# Patient Record
Sex: Male | Born: 1995
Health system: Southern US, Community
[De-identification: ages and names within clinical notes are randomized; demographics above are authoritative.]

## PROBLEM LIST (undated history)

## (undated) DIAGNOSIS — R109 Unspecified abdominal pain: Secondary | ICD-10-CM

## (undated) DIAGNOSIS — R110 Nausea: Secondary | ICD-10-CM

## (undated) DIAGNOSIS — K5900 Constipation, unspecified: Secondary | ICD-10-CM

## (undated) HISTORY — DX: Unspecified abdominal pain: R10.9

## (undated) HISTORY — PX: TONSILLECTOMY: SUR1361

## (undated) HISTORY — DX: Nausea: R11.0

## (undated) HISTORY — PX: EUSTACHIAN TUBE DILATION: SHX6770

## (undated) HISTORY — DX: Constipation, unspecified: K59.00

---
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
## 2016-04-28 DIAGNOSIS — J019 Acute sinusitis, unspecified: Secondary | ICD-10-CM | POA: Diagnosis not present

## 2016-04-28 DIAGNOSIS — R110 Nausea: Secondary | ICD-10-CM | POA: Diagnosis not present

## 2016-05-25 DIAGNOSIS — Z713 Dietary counseling and surveillance: Secondary | ICD-10-CM | POA: Diagnosis not present

## 2016-05-25 DIAGNOSIS — Z7189 Other specified counseling: Secondary | ICD-10-CM | POA: Diagnosis not present

## 2016-05-25 DIAGNOSIS — Z0001 Encounter for general adult medical examination with abnormal findings: Secondary | ICD-10-CM | POA: Diagnosis not present

## 2016-10-13 ENCOUNTER — Encounter: Payer: Self-pay | Admitting: Emergency Medicine

## 2016-10-13 DIAGNOSIS — F17290 Nicotine dependence, other tobacco product, uncomplicated: Secondary | ICD-10-CM | POA: Diagnosis not present

## 2016-10-13 DIAGNOSIS — R112 Nausea with vomiting, unspecified: Secondary | ICD-10-CM | POA: Insufficient documentation

## 2016-10-13 LAB — URINALYSIS, COMPLETE (UACMP) WITH MICROSCOPIC
BILIRUBIN URINE: NEGATIVE
Bacteria, UA: NONE SEEN
GLUCOSE, UA: NEGATIVE mg/dL
Ketones, ur: NEGATIVE mg/dL
LEUKOCYTES UA: NEGATIVE
Nitrite: NEGATIVE
PH: 6 (ref 5.0–8.0)
Protein, ur: NEGATIVE mg/dL
SPECIFIC GRAVITY, URINE: 1.017 (ref 1.005–1.030)
SQUAMOUS EPITHELIAL / LPF: NONE SEEN

## 2016-10-13 LAB — CBC
HCT: 40.6 % (ref 40.0–52.0)
Hemoglobin: 14.3 g/dL (ref 13.0–18.0)
MCH: 30.7 pg (ref 26.0–34.0)
MCHC: 35.3 g/dL (ref 32.0–36.0)
MCV: 86.9 fL (ref 80.0–100.0)
PLATELETS: 229 10*3/uL (ref 150–440)
RBC: 4.68 MIL/uL (ref 4.40–5.90)
RDW: 12.5 % (ref 11.5–14.5)
WBC: 10.3 10*3/uL (ref 3.8–10.6)

## 2016-10-13 NOTE — ED Triage Notes (Signed)
Pt states that he has been feeling nauseated for a few hours and threw up one time. Pt is ambulatory to triage with c/o lump in throat that is getting bigger. No visible mass spotted in airway by this RN. Pt is in NAD at this time.

## 2016-10-14 ENCOUNTER — Emergency Department
Admission: EM | Admit: 2016-10-14 | Discharge: 2016-10-14 | Disposition: A | Discharge status: Home / Self Care | Payer: 59 | Attending: Emergency Medicine | Admitting: Emergency Medicine

## 2016-10-14 DIAGNOSIS — R112 Nausea with vomiting, unspecified: Secondary | ICD-10-CM

## 2016-10-14 LAB — COMPREHENSIVE METABOLIC PANEL
ALT: 14 U/L — AB (ref 17–63)
ANION GAP: 7 (ref 5–15)
AST: 21 U/L (ref 15–41)
Albumin: 5 g/dL (ref 3.5–5.0)
Alkaline Phosphatase: 63 U/L (ref 38–126)
BUN: 15 mg/dL (ref 6–20)
CALCIUM: 9.6 mg/dL (ref 8.9–10.3)
CHLORIDE: 105 mmol/L (ref 101–111)
CO2: 27 mmol/L (ref 22–32)
CREATININE: 1.1 mg/dL (ref 0.61–1.24)
Glucose, Bld: 102 mg/dL — ABNORMAL HIGH (ref 65–99)
Potassium: 3.8 mmol/L (ref 3.5–5.1)
SODIUM: 139 mmol/L (ref 135–145)
Total Bilirubin: 0.8 mg/dL (ref 0.3–1.2)
Total Protein: 7.8 g/dL (ref 6.5–8.1)

## 2016-10-14 LAB — LIPASE, BLOOD: LIPASE: 21 U/L (ref 11–51)

## 2016-10-14 MED ORDER — ONDANSETRON HCL 4 MG PO TABS: 4.0000 mg | ORAL_TABLET | Freq: Every day | ORAL | 0 refills | Status: DC | PRN

## 2016-10-14 NOTE — ED Provider Notes (Signed)
[facility] Emergency Department Provider Note  ____________________________________________   First MD Initiated Contact with Patient 10/14/16 [phone]     (approximate)  I have reviewed the triage vital signs and the nursing notes.   HISTORY  Chief Complaint Emesis   HPI Carl Krueger is a 21 y.o. male with a history of abdominal pain, nausea and constipation was present to the emergency department today with nausea, vomiting and near syncope after vomiting. He is here with his family who says that these issues have been ongoing for years. He was supposed to have an appointment with gastroenterology this past April but he missed the appointment. His mother says that it is very difficult to get the patient to follow-up with his doctors appointments. The patient at this point says that he is asymptomatic. He no longer has the feeling of a lump in his throat which he had just after vomiting. He is not feeling lightheaded, nauseous and denies any pain at this time. He says that he also just had a normal bowel movement prior to me examining him.   Past Medical History:  Diagnosis Date  . Abdominal pain   . Constipation   . Nausea     Patient Active Problem List   Diagnosis Date Noted  . Constipation   . Nausea   . Abdominal pain   . ALLERGIC RHINITIS 01/29/2010    Past Surgical History:  Procedure Laterality Date  . TONSILLECTOMY      Prior to Admission medications   Medication Sig Start Date End Date Taking? Authorizing Provider  polyethylene glycol powder (GLYCOLAX/MIRALAX) powder Take 17 g by mouth daily.    [provider]  promethazine (PHENERGAN) 12.5 MG tablet Take 1 tablet (12.5 mg total) by mouth every 6 (six) hours as needed for nausea or vomiting. 11/06/15   Rebecka ApleyWebster, Allison P, MD    Allergies Patient has no known allergies.  No family history on file.  Social History Social History  Substance Use Topics  . Smoking  status: Current Every Day Smoker    Types: E-cigarettes  . Smokeless tobacco: Never Used  . Alcohol use No    Review of Systems  Constitutional: No fever/chills Eyes: No visual changes. ENT: No sore throat. Cardiovascular: Denies chest pain. Respiratory: Denies shortness of breath. Gastrointestinal: No abdominal pain.   No diarrhea.  No constipation. Genitourinary: Negative for dysuria. Musculoskeletal: Negative for back pain. Skin: Negative for rash. Neurological: Negative for headaches, focal weakness or numbness.   ____________________________________________   PHYSICAL EXAM:  VITAL SIGNS: ED Triage Vitals  Enc Vitals Group     BP 10/13/16 2315 109/63     Pulse Rate 10/13/16 2315 66     Resp 10/13/16 2315 18     Temp 10/14/16 0325 98.3 F (36.8 C)     Temp Source 10/14/16 0325 Oral     SpO2 10/13/16 2315 100 %     Weight 10/13/16 2316 140 lb (63.5 kg)     Height 10/13/16 2316 5\' 8"  (1.727 m)     Head Circumference --      Peak Flow --      Pain Score 10/13/16 2314 0     Pain Loc --      Pain Edu? --      Excl. in GC? --     Constitutional: Alert and oriented. Well appearing and in no acute distress. Eyes: Conjunctivae are normal.  Head: Atraumatic. Nose: No congestion/rhinnorhea. Mouth/Throat: Mucous membranes are  moist.  Neck: No stridor.   Cardiovascular: Normal rate, regular rhythm. Grossly normal heart sounds.  Respiratory: Normal respiratory effort.  No retractions. Lungs CTAB. Gastrointestinal: Soft and nontender. No distention. No CVA tenderness. Musculoskeletal: No lower extremity tenderness nor edema.  No joint effusions. Neurologic:  Normal speech and language. No gross focal neurologic deficits are appreciated. Skin:  Skin is warm, dry and intact. No rash noted. Psychiatric: Mood and affect are normal. Speech and behavior are normal.  ____________________________________________   LABS (all labs ordered are listed, but only abnormal results  are displayed)  Labs Reviewed  COMPREHENSIVE METABOLIC PANEL - Abnormal; Notable for the following:       Result Value   Glucose, Bld 102 (*)    ALT 14 (*)    All other components within normal limits  URINALYSIS, COMPLETE (UACMP) WITH MICROSCOPIC - Abnormal; Notable for the following:    Color, Urine YELLOW (*)    APPearance CLEAR (*)    Hgb urine dipstick MODERATE (*)    All other components within normal limits  LIPASE, BLOOD  CBC   ____________________________________________  EKG   ____________________________________________  RADIOLOGY   ____________________________________________   PROCEDURES  Procedure(s) performed:   Procedures  Critical Care performed:   ____________________________________________   INITIAL IMPRESSION / ASSESSMENT AND PLAN / ED COURSE  Pertinent labs & imaging results that were available during my care of the patient were reviewed by me and considered in my medical decision making (see chart for details).  Patient with very reassuring vital signs as well as labs. Ongoing issues for years. I do not believe the patient requires any further workup in the emergency department today. I urged the patient as well as his family to reschedule with the gastroenterologist. Vivianne Spencehey're also requesting a prescription for Zofran. Patient has been able to tolerate by mouth fluids since vomiting last several hours ago. Likely vagal episode causing the lightheadedness after vomiting. The patient has not had any episodes since. He is not reporting any chest pain or palpitations.      ____________________________________________   FINAL CLINICAL IMPRESSION(S) / ED DIAGNOSES  Nausea and vomiting.    NEW MEDICATIONS STARTED DURING THIS VISIT:  New Prescriptions   No medications on file     Note:  This document was prepared using Dragon voice recognition software and may include unintentional dictation errors.     Myrna BlazerSchaevitz, Fermon Ureta Matthew,  MD 10/14/16 [phone]

## 2016-11-21 DIAGNOSIS — R1031 Right lower quadrant pain: Secondary | ICD-10-CM | POA: Diagnosis not present

## 2016-11-21 DIAGNOSIS — R110 Nausea: Secondary | ICD-10-CM | POA: Diagnosis not present

## 2016-11-21 DIAGNOSIS — K30 Functional dyspepsia: Secondary | ICD-10-CM | POA: Diagnosis not present

## 2016-12-18 ENCOUNTER — Emergency Department: Payer: 59

## 2016-12-18 ENCOUNTER — Emergency Department
Admission: EM | Admit: 2016-12-18 | Discharge: 2016-12-18 | Disposition: A | Discharge status: Home / Self Care | Payer: 59 | Attending: Emergency Medicine | Admitting: Emergency Medicine

## 2016-12-18 ENCOUNTER — Encounter: Payer: Self-pay | Admitting: Emergency Medicine

## 2016-12-18 DIAGNOSIS — F17290 Nicotine dependence, other tobacco product, uncomplicated: Secondary | ICD-10-CM | POA: Diagnosis not present

## 2016-12-18 DIAGNOSIS — R1032 Left lower quadrant pain: Secondary | ICD-10-CM | POA: Diagnosis not present

## 2016-12-18 DIAGNOSIS — N200 Calculus of kidney: Secondary | ICD-10-CM | POA: Insufficient documentation

## 2016-12-18 LAB — URINALYSIS, COMPLETE (UACMP) WITH MICROSCOPIC
BACTERIA UA: NONE SEEN
BILIRUBIN URINE: NEGATIVE
Glucose, UA: NEGATIVE mg/dL
HGB URINE DIPSTICK: NEGATIVE
Ketones, ur: NEGATIVE mg/dL
Leukocytes, UA: NEGATIVE
Nitrite: NEGATIVE
PROTEIN: NEGATIVE mg/dL
RBC / HPF: NONE SEEN RBC/hpf (ref 0–5)
Specific Gravity, Urine: 1.012 (ref 1.005–1.030)
Squamous Epithelial / LPF: NONE SEEN
WBC UA: NONE SEEN WBC/hpf (ref 0–5)
pH: 8 (ref 5.0–8.0)

## 2016-12-18 LAB — LIPASE, BLOOD: LIPASE: 18 U/L (ref 11–51)

## 2016-12-18 LAB — CBC
HEMATOCRIT: 42.3 % (ref 40.0–52.0)
HEMOGLOBIN: 14.6 g/dL (ref 13.0–18.0)
MCH: 30.6 pg (ref 26.0–34.0)
MCHC: 34.5 g/dL (ref 32.0–36.0)
MCV: 88.7 fL (ref 80.0–100.0)
Platelets: 235 10*3/uL (ref 150–440)
RBC: 4.76 MIL/uL (ref 4.40–5.90)
RDW: 12.5 % (ref 11.5–14.5)
WBC: 7.7 10*3/uL (ref 3.8–10.6)

## 2016-12-18 LAB — COMPREHENSIVE METABOLIC PANEL
ALBUMIN: 4.9 g/dL (ref 3.5–5.0)
ALT: 11 U/L — ABNORMAL LOW (ref 17–63)
ANION GAP: 8 (ref 5–15)
AST: 20 U/L (ref 15–41)
Alkaline Phosphatase: 59 U/L (ref 38–126)
BUN: 11 mg/dL (ref 6–20)
CALCIUM: 10.4 mg/dL — AB (ref 8.9–10.3)
CHLORIDE: 103 mmol/L (ref 101–111)
CO2: 28 mmol/L (ref 22–32)
Creatinine, Ser: 1.02 mg/dL (ref 0.61–1.24)
GFR calc non Af Amer: >60 mL/min (ref >60)
Glucose, Bld: 104 mg/dL — ABNORMAL HIGH (ref 65–99)
POTASSIUM: 3.9 mmol/L (ref 3.5–5.1)
Sodium: 139 mmol/L (ref 135–145)
Total Bilirubin: 0.8 mg/dL (ref 0.3–1.2)
Total Protein: 7.5 g/dL (ref 6.5–8.1)

## 2016-12-18 MED ORDER — HYDROCODONE-ACETAMINOPHEN 5-325 MG PO TABS: 1.0000 | ORAL_TABLET | Freq: Four times a day (QID) | ORAL | 0 refills | Status: DC | PRN

## 2016-12-18 MED ORDER — IBUPROFEN 600 MG PO TABS: 600.0000 mg | ORAL_TABLET | Freq: Three times a day (TID) | ORAL | 0 refills | Status: DC | PRN

## 2016-12-18 NOTE — ED Notes (Signed)
ED Provider at bedside. 

## 2016-12-18 NOTE — Discharge Instructions (Signed)
Please make sure you remain well-hydrated and take your pain medication only as needed for severe pain. Follow up with your primary care physician as needed and return to the emergency department sooner for any new or worsening symptoms such as fevers, chills, worsening pain, if you cannot eat or drink, or for any other concerns. It is normal for it to take up to a full 2 weeks for the stone to pass.  It was a pleasure to take care of you today, and thank you for coming to our emergency department.  If you have any questions or concerns before leaving please ask the nurse to grab me and I'm more than happy to go through your aftercare instructions again.  If you were prescribed any opioid pain medication today such as Norco, Vicodin, Percocet, morphine, hydrocodone, or oxycodone please make sure you do not drive when you are taking this medication as it can alter your ability to drive safely.  If you have any concerns once you are home that you are not improving or are in fact getting worse before you can make it to your follow-up appointment, please do not hesitate to call 911 and come back for further evaluation.  Merrily Brittle, MD  Results for orders placed or performed during the hospital encounter of 12/18/16  Lipase, blood  Result Value Ref Range   Lipase 18 11 - 51 U/L  Comprehensive metabolic panel  Result Value Ref Range   Sodium 139 135 - 145 mmol/L   Potassium 3.9 3.5 - 5.1 mmol/L   Chloride 103 101 - 111 mmol/L   CO2 28 22 - 32 mmol/L   Glucose, Bld 104 (H) 65 - 99 mg/dL   BUN 11 6 - 20 mg/dL   Creatinine, Ser 1.61 0.61 - 1.24 mg/dL   Calcium 09.6 (H) 8.9 - 10.3 mg/dL   Total Protein 7.5 6.5 - 8.1 g/dL   Albumin 4.9 3.5 - 5.0 g/dL   AST 20 15 - 41 U/L   ALT 11 (L) 17 - 63 U/L   Alkaline Phosphatase 59 38 - 126 U/L   Total Bilirubin 0.8 0.3 - 1.2 mg/dL   GFR calc non Af Amer >60 >60 mL/min   GFR calc Af Amer >60 >60 mL/min   Anion gap 8 5 - 15  CBC  Result Value Ref  Range   WBC 7.7 3.8 - 10.6 K/uL   RBC 4.76 4.40 - 5.90 MIL/uL   Hemoglobin 14.6 13.0 - 18.0 g/dL   HCT 04.5 40.9 - 81.1 %   MCV 88.7 80.0 - 100.0 fL   MCH 30.6 26.0 - 34.0 pg   MCHC 34.5 32.0 - 36.0 g/dL   RDW 91.4 78.2 - 95.6 %   Platelets 235 150 - 440 K/uL  Urinalysis, Complete w Microscopic  Result Value Ref Range   Color, Urine YELLOW (A) YELLOW   APPearance CLOUDY (A) CLEAR   Specific Gravity, Urine 1.012 1.005 - 1.030   pH 8.0 5.0 - 8.0   Glucose, UA NEGATIVE NEGATIVE mg/dL   Hgb urine dipstick NEGATIVE NEGATIVE   Bilirubin Urine NEGATIVE NEGATIVE   Ketones, ur NEGATIVE NEGATIVE mg/dL   Protein, ur NEGATIVE NEGATIVE mg/dL   Nitrite NEGATIVE NEGATIVE   Leukocytes, UA NEGATIVE NEGATIVE   RBC / HPF NONE SEEN 0 - 5 RBC/hpf   WBC, UA NONE SEEN 0 - 5 WBC/hpf   Bacteria, UA NONE SEEN NONE SEEN   Squamous Epithelial / LPF NONE SEEN NONE SEEN  Ct Renal Stone Study  Result Date: 12/18/2016 CLINICAL DATA:  Left lower quadrant pain and nausea. Left flank pain. EXAM: CT ABDOMEN AND PELVIS WITHOUT CONTRAST TECHNIQUE: Multidetector CT imaging of the abdomen and pelvis was performed following the standard protocol without IV contrast. COMPARISON:  CT abdomen pelvis 12/29/2013 FINDINGS: Lower chest: No pulmonary nodules or pleural effusion. No visible pericardial effusion. Hepatobiliary: Normal hepatic contours and density. No visible biliary dilatation. Normal gallbladder. Pancreas: Normal contours without ductal dilatation. No peripancreatic fluid collection. Spleen: Normal. Adrenals/Urinary Tract: --Adrenal glands: Normal. --Right kidney/ureter: No hydronephrosis or perinephric stranding. No nephrolithiasis. No obstructing ureteral stones. --Left kidney/ureter: 2 mm nonobstructive upper and lower interpolar region renal calculi. No hydronephrosis. The ureter is unobstructed. --Urinary bladder: Unremarkable. Stomach/Bowel: --Stomach/Duodenum: No hiatal hernia or other gastric abnormality.  Normal duodenal course and caliber. --Small bowel: No dilatation or inflammation. --Colon: No focal abnormality. --Appendix: Not visualized. No right lower quadrant inflammation or free fluid. Vascular/Lymphatic: Normal course and caliber of the major abdominal vessels. No abdominal or pelvic lymphadenopathy. Reproductive: Normal prostate and seminal vesicles. Unchanged small amounts of free fluid in the lower right pelvis. Musculoskeletal. No bony spinal canal stenosis or focal osseous abnormality. Other: None. IMPRESSION: 1. No obstructive uropathy. Nonobstructive left nephrolithiasis, measuring up to 2 mm. 2. Unchanged small amount of nonspecific fluid in the right lower pelvis. No enteric or colonic inflammatory findings. Electronically Signed   By: Deatra Robinson M.D.   On: 12/18/2016 14:41

## 2016-12-18 NOTE — ED Provider Notes (Signed)
[facility] Emergency Department Provider Note  ____________________________________________   First MD Initiated Contact with Patient 12/18/16 1403     (approximate)  I have reviewed the triage vital signs and the nursing notes.   HISTORY  Chief Complaint Abdominal Pain   HPI Carl Krueger is a 21 y.o. male who self presents to the emergency department with sudden onset moderate severity left flank pain that began at 9 AM roughly 2 hours prior to arrival.the pain is nonradiating. He's never had this pain before. He denies fevers or chills. The pain is constant. Nothing seems to make it better or worse. He denies dysuria frequency hesitancy or hematuria. He denies testicular pain.   Past Medical History:  Diagnosis Date  . Abdominal pain   . Constipation   . Nausea     Patient Active Problem List   Diagnosis Date Noted  . Constipation   . Nausea   . Abdominal pain   . ALLERGIC RHINITIS 01/29/2010    Past Surgical History:  Procedure Laterality Date  . TONSILLECTOMY      Prior to Admission medications   Medication Sig Start Date End Date Taking? Authorizing Provider  HYDROcodone-acetaminophen (NORCO) 5-325 MG tablet Take 1 tablet by mouth every 6 (six) hours as needed for severe pain. 12/18/16   Merrily Brittle, MD  ibuprofen (ADVIL,MOTRIN) 600 MG tablet Take 1 tablet (600 mg total) by mouth every 8 (eight) hours as needed. 12/18/16   Merrily Brittle, MD  ondansetron (ZOFRAN) 4 MG tablet Take 1 tablet (4 mg total) by mouth daily as needed. 10/14/16   Schaevitz, Myra Rude, MD  polyethylene glycol powder (GLYCOLAX/MIRALAX) powder Take 17 g by mouth daily.    [provider]  promethazine (PHENERGAN) 12.5 MG tablet Take 1 tablet (12.5 mg total) by mouth every 6 (six) hours as needed for nausea or vomiting. 11/06/15   Rebecka Apley, MD    Allergies Patient has no known allergies.  No family history on file.  Social  History Social History  Substance Use Topics  . Smoking status: Current Every Day Smoker    Types: E-cigarettes  . Smokeless tobacco: Never Used  . Alcohol use No    Review of Systems onstitutional: No fever/chills Eyes: No visual changes. ENT: No sore throat. Cardiovascular: Denies chest pain. Respiratory: Denies shortness of breath. Gastrointestinal: positive for abdominal pain.  No nausea, no vomiting.  No diarrhea.  No constipation. Genitourinary: Negative for dysuria. Musculoskeletal: positive for for back pain. Skin: Negative for rash. Neurological: Negative for headaches, focal weakness or numbness.   ____________________________________________   PHYSICAL EXAM:  VITAL SIGNS: ED Triage Vitals  Enc Vitals Group     BP 12/18/16 1153 105/62     Pulse Rate 12/18/16 1153 (!) 52     Resp 12/18/16 1153 16     Temp 12/18/16 1153 97.6 F (36.4 C)     Temp Source 12/18/16 1153 Oral     SpO2 12/18/16 1153 100 %     Weight 12/18/16 1156 140 lb (63.5 kg)     Height 12/18/16 1156  (1.727 m)     Head Circumference --      Peak Flow --      Pain Score 12/18/16 1155 8     Pain Loc --      Pain Edu? --      Excl. in GC? --     Constitutional: alert and oriented 4 well appearing nontoxic no diaphoresis speaks  in full clear sentences  Eyes: PERRL EOMI. Head: Atraumatic. Nose: No congestion/rhinnorhea. Mouth/Throat: No trismus Neck: No stridor.   Cardiovascular: Normal rate, regular rhythm. Grossly normal heart sounds.  Good peripheral circulation. Respiratory: Normal respiratory effort.  No retractions. Lungs CTAB and moving good air Gastrointestinal: soft nondistended nontender no rebound or guarding no peritonitis no costovertebral tenderness no McBurney's tenderness Musculoskeletal: No lower extremity edema   Neurologic:  Normal speech and language. No gross focal neurologic deficits are appreciated. Skin:  Skin is warm, dry and intact. No rash  noted. Psychiatric: Mood and affect are normal. Speech and behavior are normal.    ____________________________________________   DIFFERENTIAL includes but not limited to  urinary tract infection, pyelonephritis, pneumonia, renal colic ____________________________________________   LABS (all labs ordered are listed, but only abnormal results are displayed)  Labs Reviewed  COMPREHENSIVE METABOLIC PANEL - Abnormal; Notable for the following:       Result Value   Glucose, Bld 104 (*)    Calcium 10.4 (*)    ALT 11 (*)    All other components within normal limits  URINALYSIS, COMPLETE (UACMP) WITH MICROSCOPIC - Abnormal; Notable for the following:    Color, Urine YELLOW (*)    APPearance CLOUDY (*)    All other components within normal limits  LIPASE, BLOOD  CBC    urinalysis and blood work reviewed by me show no evidence of infection and no hematuria __________________________________________  EKG   ____________________________________________  RADIOLOGY  CT scan stone protocol is consistent with multiple left-sided kidney stones ____________________________________________   PROCEDURES  Procedure(s) performed: no  Procedures  Critical Care performed: no  Observation: no ____________________________________________   INITIAL IMPRESSION / ASSESSMENT AND PLAN / ED COURSE  Pertinent labs & imaging results that were available during my care of the patient were reviewed by me and considered in my medical decision making (see chart for details).  on arrival the patient reports discomfort although declines pain medication. His exam is benign. I appreciate that he has no hematuria however in an early kidney stone this is possible. Given his left flank pain without other clear cause we will CT her now.     ----------------------------------------- 3:18 PM on 12/18/2016 -----------------------------------------  The patient's pain is adequately controlled and he  is able to eat and drink. CT scan does confirm 2 small stones within his left kidney and I believe he likely does have a stone in his left ureter that is so small that it went in between the cuts of the CT scan. We discussed fever return precautions. ____________________________________________   FINAL CLINICAL IMPRESSION(S) / ED DIAGNOSES  Final diagnoses:  Kidney stone      NEW MEDICATIONS STARTED DURING THIS VISIT:  Discharge Medication List as of 12/18/2016  3:17 PM    START taking these medications   Details  HYDROcodone-acetaminophen (NORCO) 5-325 MG tablet Take 1 tablet by mouth every 6 (six) hours as needed for severe pain., Starting Mon 12/18/2016, Print    ibuprofen (ADVIL,MOTRIN) 600 MG tablet Take 1 tablet (600 mg total) by mouth every 8 (eight) hours as needed., Starting Mon 12/18/2016, Print         Note:  This document was prepared using Dragon voice recognition software and may include unintentional dictation errors.     Merrily Brittle, MD 12/18/16 2310

## 2016-12-18 NOTE — ED Triage Notes (Signed)
C/O LLQ abdominal pain that started this morning.  Symptoms accompanied by nausea.  Denies emesis or diarrhea.

## 2017-04-11 DIAGNOSIS — J988 Other specified respiratory disorders: Secondary | ICD-10-CM | POA: Diagnosis not present

## 2017-05-17 ENCOUNTER — Emergency Department: Admission: EM | Admit: 2017-05-17 | Discharge: 2017-05-17 | Discharge status: Against Medical Advice | Payer: 59

## 2017-05-17 NOTE — ED Notes (Signed)
Patient has decided not to stay for triage or evaluation.  Patient took his medicine form home and believes this is related to a kidney stone.  Patient advised to stay, but declined.

## 2017-10-23 ENCOUNTER — Emergency Department
Admission: EM | Admit: 2017-10-23 | Discharge: 2017-10-23 | Disposition: A | Discharge status: Home / Self Care | Payer: BLUE CROSS/BLUE SHIELD | Attending: Emergency Medicine | Admitting: Emergency Medicine

## 2017-10-23 ENCOUNTER — Emergency Department: Payer: BLUE CROSS/BLUE SHIELD

## 2017-10-23 ENCOUNTER — Encounter: Payer: Self-pay | Admitting: Emergency Medicine

## 2017-10-23 DIAGNOSIS — W1789XA Other fall from one level to another, initial encounter: Secondary | ICD-10-CM | POA: Diagnosis not present

## 2017-10-23 DIAGNOSIS — M545 Low back pain: Secondary | ICD-10-CM | POA: Diagnosis not present

## 2017-10-23 DIAGNOSIS — F17290 Nicotine dependence, other tobacco product, uncomplicated: Secondary | ICD-10-CM | POA: Insufficient documentation

## 2017-10-23 DIAGNOSIS — Z79899 Other long term (current) drug therapy: Secondary | ICD-10-CM | POA: Diagnosis not present

## 2017-10-23 DIAGNOSIS — M79672 Pain in left foot: Secondary | ICD-10-CM | POA: Diagnosis not present

## 2017-10-23 DIAGNOSIS — W19XXXA Unspecified fall, initial encounter: Secondary | ICD-10-CM

## 2017-10-23 DIAGNOSIS — S3992XA Unspecified injury of lower back, initial encounter: Secondary | ICD-10-CM | POA: Diagnosis not present

## 2017-10-23 MED ORDER — ONDANSETRON 4 MG PO TBDP
ORAL_TABLET | ORAL | Status: AC
  Filled 2017-10-23: qty 1

## 2017-10-23 MED ORDER — CYCLOBENZAPRINE HCL 5 MG PO TABS: 5.0000 mg | ORAL_TABLET | Freq: Three times a day (TID) | ORAL | 0 refills | Status: DC | PRN

## 2017-10-23 MED ORDER — ONDANSETRON 4 MG PO TBDP
4.0000 mg | ORAL_TABLET | Freq: Once | ORAL | Status: AC
  Administered 2017-10-23: 4 mg via ORAL

## 2017-10-23 NOTE — ED Triage Notes (Signed)
Pt fell approx. 7320ft from tree landing on ground. Pts fall witnessed and witness reports pt fell onto feet and fell back onto back. Pt c/o back pain and left foot pain. Pt denies LOC.

## 2017-10-23 NOTE — ED Notes (Addendum)
Patient denies pain and is resting comfortably. C/o left foot pain only when ambulating.

## 2017-10-23 NOTE — ED Provider Notes (Signed)
[facility] Emergency Department Provider Note  Time seen: 8:20 PM  I have reviewed the triage vital signs and the nursing notes.   HISTORY  Chief Complaint Fall    HPI AMARA MANALANG is a 22 y.o. male with no past medical history who presents to the emergency department after a fall from a deer stand.  According to the patient he was approximate 20 feet off the ground when his deer stand broke causing him to fall down from the deer stand to the ground he estimates 15 to 20 feet.  States he landed on his feet and rolled backwards onto his back.  Did not hit his head.  Denies LOC.  States he has been able to walk around but was complaining of some lower back pain and left foot pain.  Denies any neck pain or upper back pain.     Past Medical History:  Diagnosis Date  . Abdominal pain   . Constipation   . Nausea     Patient Active Problem List   Diagnosis Date Noted  . Constipation   . Nausea   . Abdominal pain   . ALLERGIC RHINITIS 01/29/2010    Past Surgical History:  Procedure Laterality Date  . TONSILLECTOMY      Prior to Admission medications   Medication Sig Start Date End Date Taking? Authorizing Provider  HYDROcodone-acetaminophen (NORCO) 5-325 MG tablet Take 1 tablet by mouth every 6 (six) hours as needed for severe pain. 12/18/16   Merrily Brittle, MD  ibuprofen (ADVIL,MOTRIN) 600 MG tablet Take 1 tablet (600 mg total) by mouth every 8 (eight) hours as needed. 12/18/16   Merrily Brittle, MD  ondansetron (ZOFRAN) 4 MG tablet Take 1 tablet (4 mg total) by mouth daily as needed. 10/14/16   Schaevitz, Myra Rude, MD  polyethylene glycol powder (GLYCOLAX/MIRALAX) powder Take 17 g by mouth daily.    [provider]  promethazine (PHENERGAN) 12.5 MG tablet Take 1 tablet (12.5 mg total) by mouth every 6 (six) hours as needed for nausea or vomiting. 11/06/15   Rebecka Apley, MD    No Known Allergies  History reviewed. No pertinent  family history.  Social History Social History   Tobacco Use  . Smoking status: Current Every Day Smoker    Types: E-cigarettes  . Smokeless tobacco: Never Used  Substance Use Topics  . Alcohol use: No  . Drug use: No    Review of Systems Constitutional: Negative for fever. Cardiovascular: Negative for chest pain. Respiratory: Negative for shortness of breath. Gastrointestinal: Negative for abdominal pain Genitourinary: Negative for urinary compaints Musculoskeletal: Negative for musculoskeletal complaints Skin: Negative for skin complaints  Neurological: Negative for headache All other ROS negative  ____________________________________________   PHYSICAL EXAM:  Constitutional: Alert and oriented. Well appearing and in no distress. Eyes: Normal exam ENT   Head: Normocephalic and atraumatic.   Mouth/Throat: Mucous membranes are moist. Cardiovascular: Normal rate, regular rhythm. No murmur Respiratory: Normal respiratory effort without tachypnea nor retractions. Breath sounds are clear  Gastrointestinal: Soft and nontender. No distention.   Musculoskeletal: Nontender with normal range of motion in all extremities.  Neurovascular intact distally.  Unable to reproduce pain in the lower back or left foot.  No CT or L-spine tenderness.  C-spine cleared clinically. Neurologic:  Normal speech and language. No gross focal neurologic deficits  Skin:  Skin is warm, dry and intact.  Psychiatric: Mood and affect are normal.   ____________________________________________   RADIOLOGY  X-rays  are negative  ____________________________________________   INITIAL IMPRESSION / ASSESSMENT AND PLAN / ED COURSE  Pertinent labs & imaging results that were available during my care of the patient were reviewed by me and considered in my medical decision making (see chart for details).  Patient presents to the emergency department after a fall from a deer stand approximately 15  to 20 feet off the ground.  Patient's only complaint is mild pain in his left foot when he is ambulating and some pain in his lower back when he moves or twists.  Describes as a mild soreness to the lower back.  Patient has been ambulatory without issue per patient.  Does state some mild nausea.  But denies hitting his head or LOC.  Overall the patient appears very well.  Will obtain x-ray imaging of the foot and back as a precaution and continue to closely monitor.  X-rays are negative for acute fracture.  Patient appears well.  Denies any pain at this time.  I discussed with the patient Tylenol or ibuprofen if needed tonight or tomorrow for discomfort/muscle stiffness.  We will prescribe a very short course of Flexeril for the patient if needed.  Patient agreeable to plan of care. ____________________________________________   FINAL CLINICAL IMPRESSION(S) / ED DIAGNOSES  Thresa RossFall    Ita Fritzsche, MD 10/23/17 2139

## 2018-09-21 IMAGING — CR DG LUMBAR SPINE COMPLETE 4+V
1 series · 5 of 5 positions shown · non-contrast
Comparison: CT 12/18/2016

CLINICAL DATA: Patient fell from tree approximately 20 feet,
falling onto back.

EXAM:
LUMBAR SPINE - COMPLETE 4+ VIEW

[Series 1: dg lumbar spine complete 4 +v · 0.14mm/px · 5 of 5 slices shown]
[im 1/5]
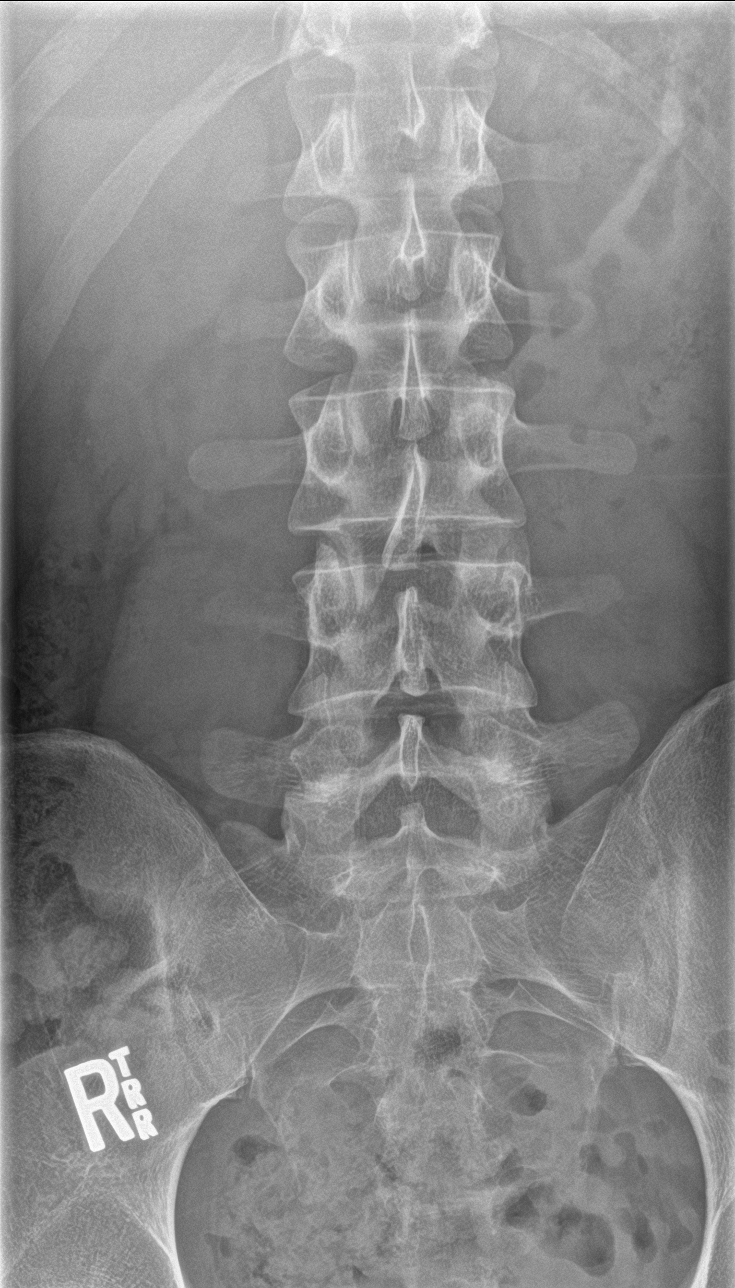
[im 2/5]
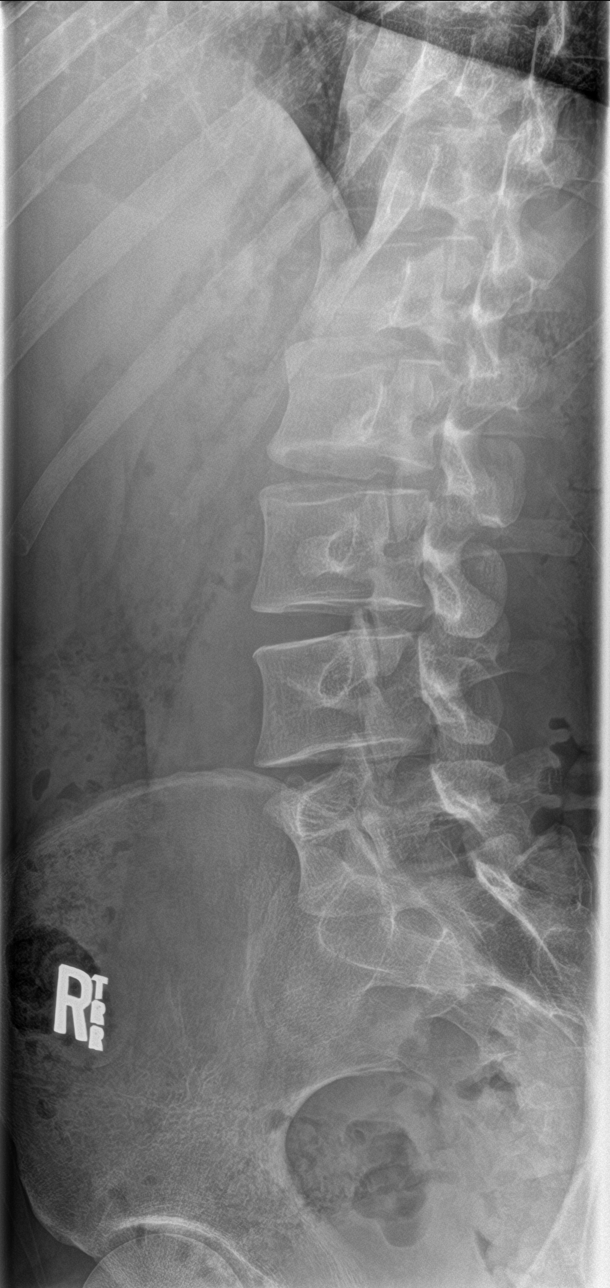
[im 3/5]
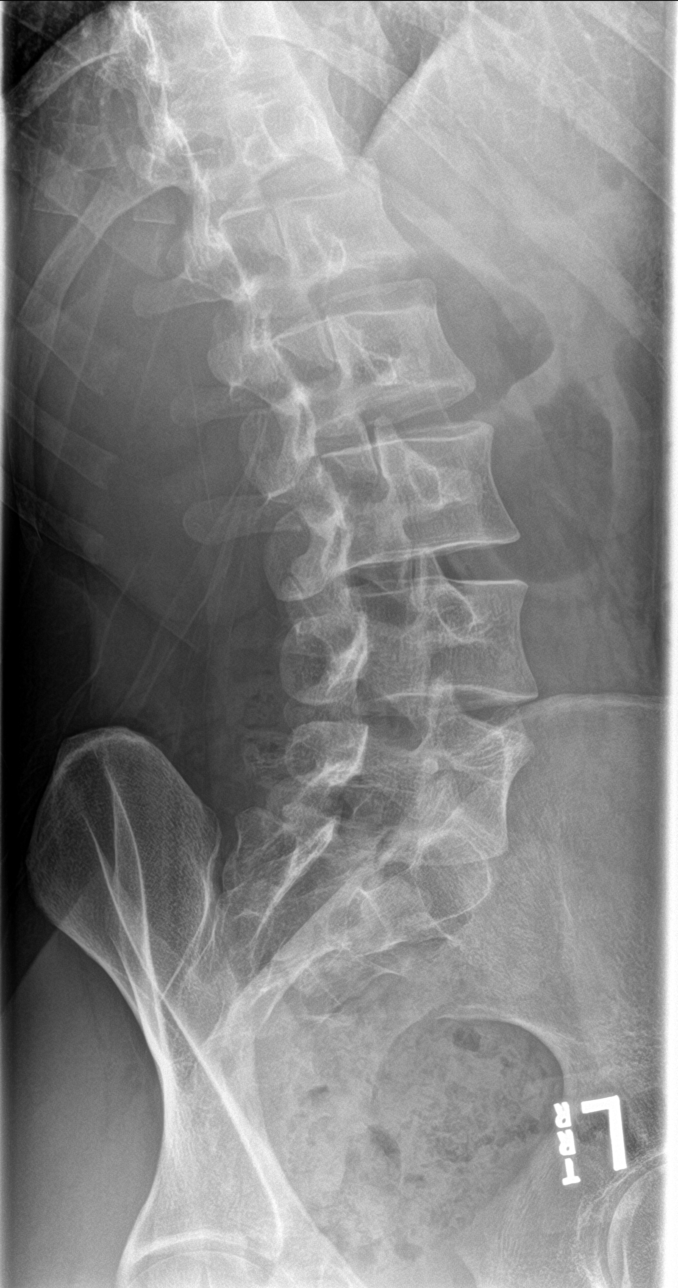
[im 4/5]
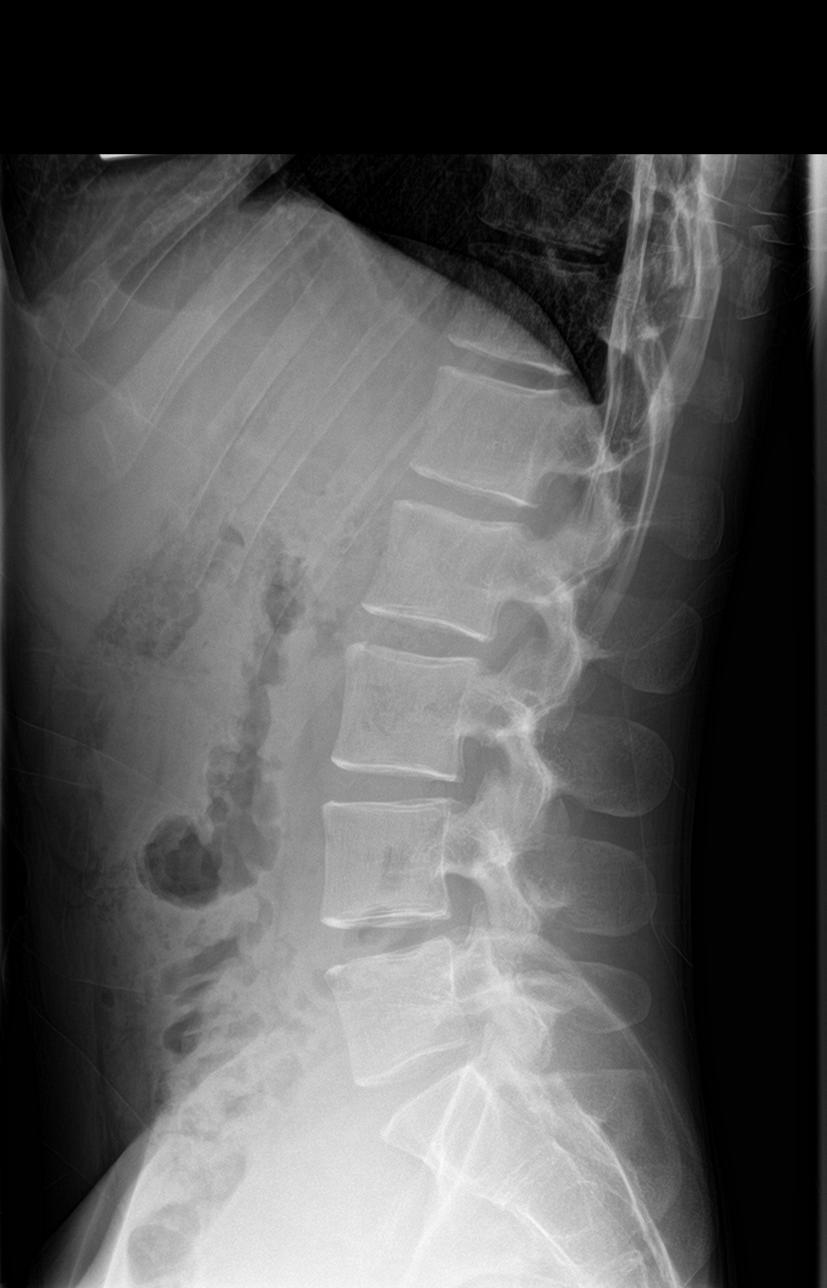
[im 5/5]
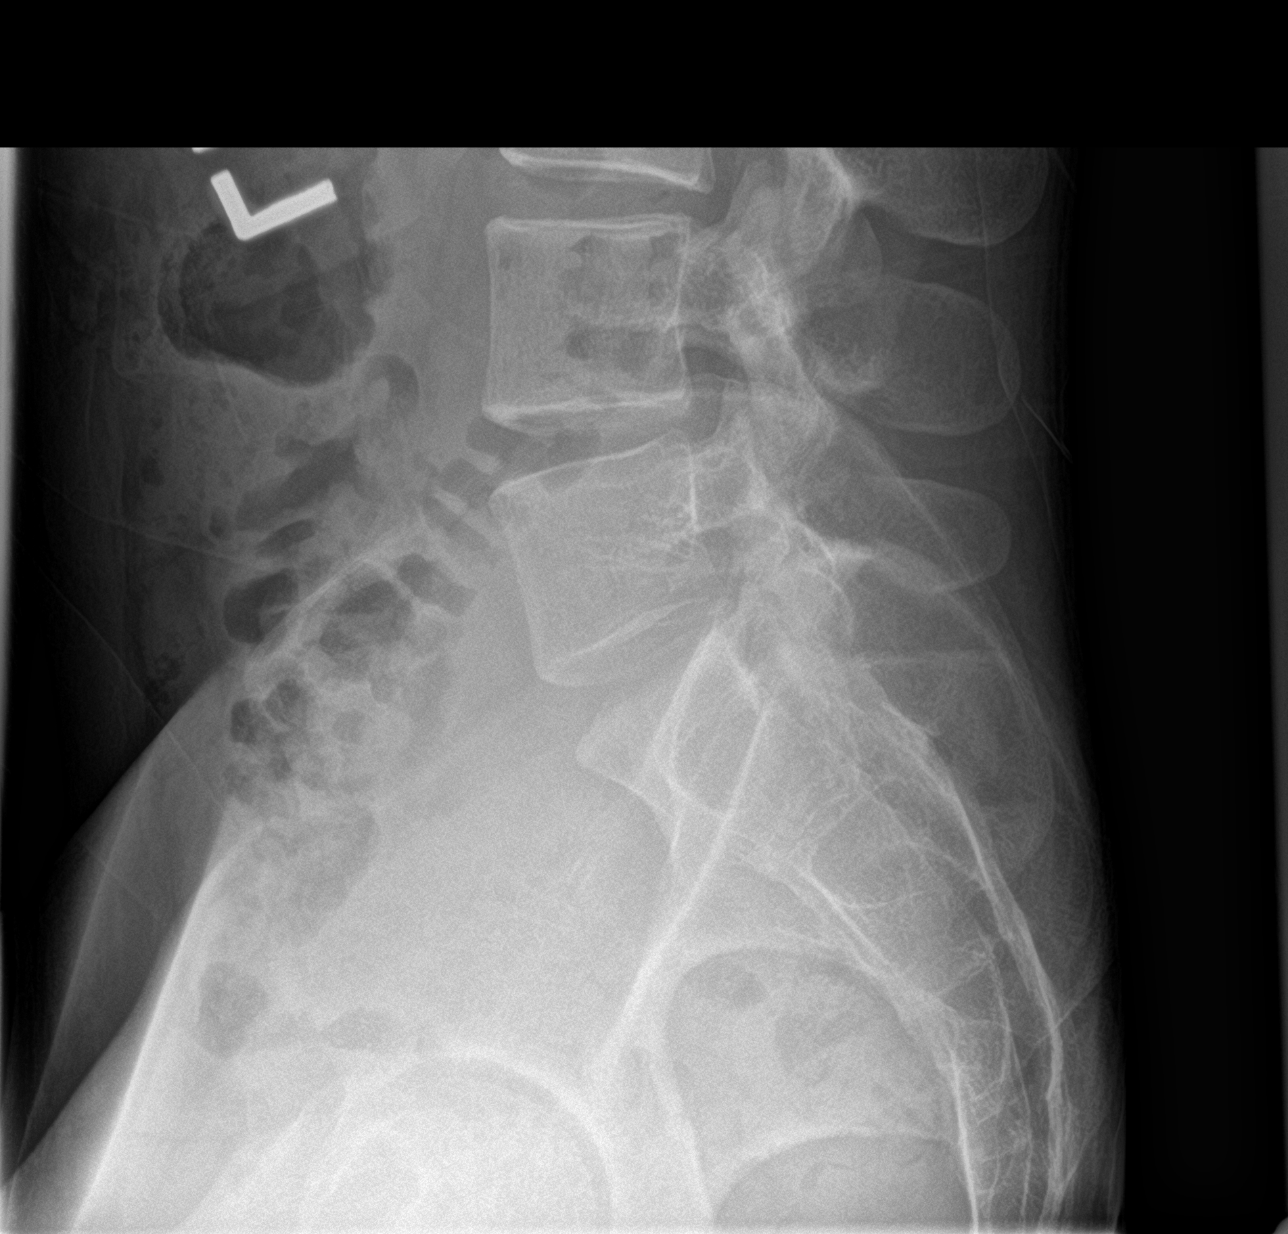

[5 of 5 positions shown; findings below may reference images not displayed]

FINDINGS: There are 5 non ribbed lumbar type vertebral bodies in anatomic
alignment. No pars defects or listhesis. There is no evidence of
lumbar spine fracture. Intervertebral disc spaces are maintained.
IMPRESSION: No fracture or static listhesis of the lumbar spine is identified.

## 2018-10-01 ENCOUNTER — Encounter: Payer: Self-pay | Admitting: Internal Medicine

## 2018-10-01 ENCOUNTER — Ambulatory Visit: Payer: 59 | Admitting: Internal Medicine

## 2018-10-01 ENCOUNTER — Other Ambulatory Visit: Payer: Self-pay

## 2018-10-01 VITALS — BP 112/73 | HR 82 | Temp 98.8°F | Resp 12 | Ht 68.0 in | Wt 144.0 lb

## 2018-10-01 DIAGNOSIS — H02846 Edema of left eye, unspecified eyelid: Secondary | ICD-10-CM

## 2018-10-01 MED ORDER — ERYTHROMYCIN 5 MG/GM OP OINT: TOPICAL_OINTMENT | OPHTHALMIC | 1 refills | Status: DC

## 2018-10-01 NOTE — Progress Notes (Signed)
S - presents with left lower eyelid irritation/swelling, and swelling under the lid some also noted, started this morning when awoke and was crustiness and felt stuck shut some, he wiped away with warm wash cloth and applied some warmth a couple times today and has helped, swelling lessened and eye redness relieved. Draining a whitish material at times, not tears   Left lower eyelid still more swollen  Annoying more than severe pain, not itch Eye is not markedly red at present  Not wear contact lenses. No vision decrease, blurred some noted and bothered by the swollen lid No lights bothersome  He noted Sunday was weed wacking and a small piece of a leaf, ? Poison sumac noted was in the inner corner of his eye and he removed and no problems Monday, before these sx's started this morning No Known Allergies  Current Outpatient Medications on File Prior to Visit  Medication Sig Dispense Refill  . ondansetron (ZOFRAN) 4 MG tablet Take 1 tablet (4 mg total) by mouth daily as needed. 10 tablet 0  . promethazine (PHENERGAN) 12.5 MG tablet Take 1 tablet (12.5 mg total) by mouth every 6 (six) hours as needed for nausea or vomiting. 30 tablet 0   No current facility-administered medications on file prior to visit.    Tob - + e-cig use  Works at [employer]   O - NAD, masked  BP 112/73 (BP Location: Right Leg, Patient Position: Sitting, Cuff Size: Normal)   Pulse 82   Temp 98.8 F (37.1 C) (Oral)   Resp 12   Ht 5\' 8"  (1.727 m)   Wt 144 lb (65.3 kg)   SpO2 97%   BMI 21.90 kg/m    HEENT - PERRL, EOMI, no discomfort with EOM testing, conjunctiva not injected bilat,  L lower lid erythematous and swollen more diffusely, with a small circular slightly raised whitish area noted centrally under lower lid with some whitish drainage present over lower lid (dabbed away with gauze by patient) no photophobia Acuity L - 20/30, R 20/20 No periorbital swelling or tenderness Affect was not flat,  approp with conversation   A/P - Lower lid Possible stye (hordoleum) - with it coming to a head and draining more after warmth applied by patient?, small cystic structure possible, eye proper without involvement at present    Educated patient on likely source  Warm compresses liberally rec'ed short term (not too hot emphasized as well)  noted not often rec drops or ointment for stye management as not help in resolution, but felt best to add e-mycin ophth ointment - apply a ribbon over inside lower lid 4X/day rec'ed   Ibuprofen - OTC strength prn with food prn  Noted importance if this is not improving fairly quickly or worsening at all in next 24-48 hours ( if develops vision changes, increasing pain, lights bothersome, or eye proper more red and painful), needs to follow-up with an eye doctor immediately and he understood.

## 2018-10-07 ENCOUNTER — Other Ambulatory Visit: Payer: Self-pay

## 2018-10-07 ENCOUNTER — Ambulatory Visit: Payer: 59 | Admitting: Internal Medicine

## 2019-01-03 ENCOUNTER — Other Ambulatory Visit: Payer: Self-pay

## 2019-01-03 DIAGNOSIS — Z20828 Contact with and (suspected) exposure to other viral communicable diseases: Secondary | ICD-10-CM | POA: Diagnosis not present

## 2019-01-03 DIAGNOSIS — Z20822 Contact with and (suspected) exposure to covid-19: Secondary | ICD-10-CM

## 2019-01-05 LAB — NOVEL CORONAVIRUS, NAA: SARS-CoV-2, NAA: NOT DETECTED

## 2019-01-21 ENCOUNTER — Other Ambulatory Visit: Payer: Self-pay

## 2019-01-21 DIAGNOSIS — Z20822 Contact with and (suspected) exposure to covid-19: Secondary | ICD-10-CM

## 2019-01-21 NOTE — Progress Notes (Unsigned)
[id number] 

## 2019-01-22 LAB — NOVEL CORONAVIRUS, NAA: SARS-CoV-2, NAA: NOT DETECTED

## 2019-01-27 ENCOUNTER — Telehealth: Payer: Self-pay | Admitting: General Practice

## 2019-01-27 NOTE — Telephone Encounter (Signed)
His test came back neg.

## 2019-02-01 ENCOUNTER — Other Ambulatory Visit: Payer: Self-pay

## 2019-02-01 ENCOUNTER — Encounter: Payer: Self-pay | Admitting: Emergency Medicine

## 2019-02-01 ENCOUNTER — Emergency Department
Admission: EM | Admit: 2019-02-01 | Discharge: 2019-02-01 | Disposition: A | Discharge status: Home / Self Care | Payer: 59 | Attending: Emergency Medicine | Admitting: Emergency Medicine

## 2019-02-01 DIAGNOSIS — Z79899 Other long term (current) drug therapy: Secondary | ICD-10-CM | POA: Diagnosis not present

## 2019-02-01 DIAGNOSIS — Y929 Unspecified place or not applicable: Secondary | ICD-10-CM | POA: Insufficient documentation

## 2019-02-01 DIAGNOSIS — Y939 Activity, unspecified: Secondary | ICD-10-CM | POA: Diagnosis not present

## 2019-02-01 DIAGNOSIS — S61412A Laceration without foreign body of left hand, initial encounter: Secondary | ICD-10-CM | POA: Insufficient documentation

## 2019-02-01 DIAGNOSIS — Y999 Unspecified external cause status: Secondary | ICD-10-CM | POA: Diagnosis not present

## 2019-02-01 DIAGNOSIS — F17290 Nicotine dependence, other tobacco product, uncomplicated: Secondary | ICD-10-CM | POA: Insufficient documentation

## 2019-02-01 DIAGNOSIS — W293XXA Contact with powered garden and outdoor hand tools and machinery, initial encounter: Secondary | ICD-10-CM | POA: Insufficient documentation

## 2019-02-01 DIAGNOSIS — Z23 Encounter for immunization: Secondary | ICD-10-CM | POA: Insufficient documentation

## 2019-02-01 DIAGNOSIS — R69 Illness, unspecified: Secondary | ICD-10-CM | POA: Diagnosis not present

## 2019-02-01 MED ORDER — TETANUS-DIPHTH-ACELL PERTUSSIS 5-2.5-18.5 LF-MCG/0.5 IM SUSP
0.5000 mL | Freq: Once | INTRAMUSCULAR | Status: AC
  Administered 2019-02-01: 16:00:00 0.5 mL via INTRAMUSCULAR
  Filled 2019-02-01: qty 0.5

## 2019-02-01 MED ORDER — CEPHALEXIN 500 MG PO CAPS: 500.0000 mg | ORAL_CAPSULE | Freq: Three times a day (TID) | ORAL | 0 refills | Status: AC

## 2019-02-01 NOTE — ED Provider Notes (Signed)
[facility] Emergency Department Provider Note  ____________________________________________  Time seen: Approximately 3:46 PM  I have reviewed the triage vital signs and the nursing notes.   HISTORY  Chief Complaint Laceration    HPI Carl Krueger is a 23 y.o. male presents to the emergency department with a 1 and half centimeter laceration along the dorsal aspect of the left hand that was sustained accidentally with a chainsaw.  Patient states he was concerned because he has used chainsaw to cut deer legs off.  Patient denies numbness or tingling in the left hand. Tetanus status is out of date.         Past Medical History:  Diagnosis Date  . Abdominal pain   . Constipation   . Nausea     Patient Active Problem List   Diagnosis Date Noted  . Constipation   . Nausea   . Abdominal pain   . ALLERGIC RHINITIS 01/29/2010    Past Surgical History:  Procedure Laterality Date  . EUSTACHIAN TUBE DILATION    . TONSILLECTOMY      Prior to Admission medications   Medication Sig Start Date End Date Taking? Authorizing Provider  cephALEXin (KEFLEX) 500 MG capsule Take 1 capsule (500 mg total) by mouth 3 (three) times daily for 7 days. 02/01/19 02/08/19  Orvil FeilWoods, Makiya Jeune M, PA-C  erythromycin ophthalmic ointment Apply a small ribbon across lower lid of affected eye four times daily 10/01/18   Jamelle HaringHendrickson, Clifford D, MD  ondansetron (ZOFRAN) 4 MG tablet Take 1 tablet (4 mg total) by mouth daily as needed. 10/14/16   Myrna BlazerSchaevitz, David Matthew, MD  promethazine (PHENERGAN) 12.5 MG tablet Take 1 tablet (12.5 mg total) by mouth every 6 (six) hours as needed for nausea or vomiting. 11/06/15   Rebecka ApleyWebster, Allison P, MD    Allergies Patient has no known allergies.  Family History  Problem Relation Age of Onset  . Diabetes Mother   . Hypertension Mother   . Constipation Mother   . Hypertension Father   . Hypertension Sister   . Hypertension Brother   .  Diabetes Maternal Grandmother   . Lung cancer Maternal Grandfather   . Diabetes Paternal Grandmother   . Headache Sister   . Seizures Brother     Social History Social History   Tobacco Use  . Smoking status: Current Every Day Smoker    Types: E-cigarettes  . Smokeless tobacco: Never Used  Substance Use Topics  . Alcohol use: No  . Drug use: No     Review of Systems  Constitutional: No fever/chills Eyes: No visual changes. No discharge ENT: No upper respiratory complaints. Cardiovascular: no chest pain. Respiratory: no cough. No SOB. Gastrointestinal: No abdominal pain.  No nausea, no vomiting.  No diarrhea.  No constipation. Musculoskeletal: Negative for musculoskeletal pain. Skin: Patient has left hand laceration.  Neurological: Negative for headaches, focal weakness or numbness.   ____________________________________________   PHYSICAL EXAM:  VITAL SIGNS: ED Triage Vitals  Enc Vitals Group     BP 02/01/19 1455 134/73     Pulse Rate 02/01/19 1455 96     Resp 02/01/19 1455 16     Temp 02/01/19 1455 99.2 F (37.3 C)     Temp Source 02/01/19 1455 Oral     SpO2 02/01/19 1455 100 %     Weight 02/01/19 1456 140 lb (63.5 kg)     Height 02/01/19 1456 5\' 8"  (1.727 m)     Head Circumference --  Peak Flow --      Pain Score 02/01/19 1456 4     Pain Loc --      Pain Edu? --      Excl. in GC? --      Constitutional: Alert and oriented. Well appearing and in no acute distress. Eyes: Conjunctivae are normal. PERRL. EOMI. Head: Atraumatic. ENT:  Cardiovascular: Normal rate, regular rhythm. Normal S1 and S2.  Good peripheral circulation. Respiratory: Normal respiratory effort without tachypnea or retractions. Lungs CTAB. Good air entry to the bases with no decreased or absent breath sounds. Gastrointestinal: Bowel sounds 4 quadrants. Soft and nontender to palpation. No guarding or rigidity. No palpable masses. No distention. No CVA tenderness. Musculoskeletal:  Full range of motion to all extremities. No gross deformities appreciated.  No flexor or extensor tendon deficits appreciated with testing. Neurologic:  Normal speech and language. No gross focal neurologic deficits are appreciated.  Skin: Patient has 1/2 cm jagged laceration across the dorsal aspect of the left hand. Psychiatric: Mood and affect are normal. Speech and behavior are normal. Patient exhibits appropriate insight and judgement.   ____________________________________________   LABS (all labs ordered are listed, but only abnormal results are displayed)  Labs Reviewed - No data to display ____________________________________________  EKG   ____________________________________________  RADIOLOGY   No results found.  ____________________________________________    PROCEDURES  Procedure(s) performed:    Procedures  LACERATION REPAIR Performed by: Orvil Feil Authorized by: Orvil Feil Consent: Verbal consent obtained. Risks and benefits: risks, benefits and alternatives were discussed Consent given by: patient Patient identity confirmed: provided demographic data Prepped and Draped in normal sterile fashion Wound explored  Laceration Location: Left hand   Laceration Length: 1.5 cm  No Foreign Bodies seen or palpated  Irrigation method: syringe Amount of cleaning: standard  Skin closure: Dermabond   Patient tolerance: Patient tolerated the procedure well with no immediate complications.    Medications  Tdap (BOOSTRIX) injection 0.5 mL (has no administration in time range)     ____________________________________________   INITIAL IMPRESSION / ASSESSMENT AND PLAN / ED COURSE  Pertinent labs & imaging results that were available during my care of the patient were reviewed by me and considered in my medical decision making (see chart for details).  Review of the [place] CSRS was performed in accordance of the [facility] prior to dispensing any  controlled drugs.         Assessment and plan Left hand laceration 23 year old male presents to the emergency department with a 1 and half centimeter laceration across the dorsal aspect of the left hand that was well approximated and repaired using Dermabond.  Patient's tetanus status was updated in the emergency department and he was discharged with Keflex.  Return precautions were given.  All patient questions were answered.   ____________________________________________  FINAL CLINICAL IMPRESSION(S) / ED DIAGNOSES  Final diagnoses:  Laceration of left hand without foreign body, initial encounter      NEW MEDICATIONS STARTED DURING THIS VISIT:  ED Discharge Orders         Ordered    cephALEXin (KEFLEX) 500 MG capsule  3 times daily     02/01/19 1544              This chart was dictated using voice recognition software/Dragon. Despite best efforts to proofread, errors can occur which can change the meaning. Any change was purely unintentional.    Orvil Feil, PA-C 02/01/19 1552    Dorothea Glassman  F, MD 02/01/19 2309

## 2019-02-01 NOTE — ED Triage Notes (Signed)
Patient cut the back of his left hand with a rusty saw blade that he states he cut the legs off a deer with last week and had not cleaned.  Wound is slightly jagged, approx. 1 in long.  Patient is in no obvious distress at this time.

## 2019-02-12 ENCOUNTER — Telehealth: Payer: Self-pay

## 2019-02-12 NOTE — Telephone Encounter (Signed)
Received 02/01/2019 office notes from [facility] ED.  Notes reviewed by Gerarda Fraction, NP-C (Interim Provider) on 02/05/2019.  Noted as follows:  Reviewed 8/8 pages.  Chainsaw laceration to hand.  Closed with dermabond.  Given Rx for Keflex, Td booster at ED.  Patient has not followed up with [facility] Provider.  Call to verify no S/X infection & healing or if F/U need to re-evaluate.  Called Carl Krueger to follow up on left hand laceration.  States he completed the ABX prescribed to him in the ED.  Said the laceration opened up & he "put glue in it" - states it hasn't opened up any more.  Denies pain, redness, swelling, warm to touch, drainage or fever.  Declined to follow-up with [facility] Provider.  Advised him to follow up or return to ED if any S/Sx of infection.  Verbalized understanding.  AMD

## 2019-03-26 DIAGNOSIS — R112 Nausea with vomiting, unspecified: Secondary | ICD-10-CM | POA: Diagnosis not present

## 2019-03-26 DIAGNOSIS — Z03818 Encounter for observation for suspected exposure to other biological agents ruled out: Secondary | ICD-10-CM | POA: Diagnosis not present

## 2019-03-31 DIAGNOSIS — R197 Diarrhea, unspecified: Secondary | ICD-10-CM | POA: Diagnosis not present

## 2019-03-31 DIAGNOSIS — R112 Nausea with vomiting, unspecified: Secondary | ICD-10-CM | POA: Diagnosis not present

## 2019-03-31 DIAGNOSIS — Z20828 Contact with and (suspected) exposure to other viral communicable diseases: Secondary | ICD-10-CM | POA: Diagnosis not present

## 2019-06-17 NOTE — Progress Notes (Signed)
Scheduled to complete physical 06/25/19 with Durward Parcel, PA-C.  AMD

## 2019-06-18 ENCOUNTER — Other Ambulatory Visit: Payer: Self-pay

## 2019-06-18 ENCOUNTER — Ambulatory Visit: Payer: Self-pay

## 2019-06-18 DIAGNOSIS — Z0000 Encounter for general adult medical examination without abnormal findings: Secondary | ICD-10-CM

## 2019-06-18 LAB — POCT URINALYSIS DIPSTICK
Bilirubin, UA: NEGATIVE
Blood, UA: NEGATIVE
Glucose, UA: NEGATIVE
Ketones, UA: NEGATIVE
Leukocytes, UA: NEGATIVE
Nitrite, UA: NEGATIVE
Protein, UA: NEGATIVE
Spec Grav, UA: 1.02 (ref 1.010–1.025)
Urobilinogen, UA: 0.2 E.U./dL
pH, UA: 7.5 (ref 5.0–8.0)

## 2019-06-19 LAB — CMP12+LP+TP+TSH+6AC+CBC/D/PLT
ALT: 13 IU/L (ref 0–44)
AST: 17 IU/L (ref 0–40)
Albumin/Globulin Ratio: 2.2 (ref 1.2–2.2)
Albumin: 5 g/dL (ref 4.1–5.2)
Alkaline Phosphatase: 73 IU/L (ref 39–117)
BUN/Creatinine Ratio: 8 — ABNORMAL LOW (ref 9–20)
BUN: 9 mg/dL (ref 6–20)
Basophils Absolute: 0 10*3/uL (ref 0.0–0.2)
Basos: 1 %
Bilirubin Total: 0.7 mg/dL (ref 0.0–1.2)
Calcium: 10.1 mg/dL (ref 8.7–10.2)
Chloride: 103 mmol/L (ref 96–106)
Chol/HDL Ratio: 2.7 ratio (ref 0.0–5.0)
Cholesterol, Total: 148 mg/dL (ref 100–199)
Creatinine, Ser: 1.17 mg/dL (ref 0.76–1.27)
EOS (ABSOLUTE): 0.1 10*3/uL (ref 0.0–0.4)
Eos: 2 %
Estimated CHD Risk: <0.5 times avg. (ref 0.0–1.0)
Free Thyroxine Index: 2.5 (ref 1.2–4.9)
GFR calc Af Amer: 102 mL/min/{1.73_m2} (ref >59)
GFR calc non Af Amer: 88 mL/min/{1.73_m2} (ref >59)
GGT: 12 IU/L (ref 0–65)
Globulin, Total: 2.3 g/dL (ref 1.5–4.5)
Glucose: 86 mg/dL (ref 65–99)
HDL: 55 mg/dL (ref >39)
Hematocrit: 43.3 % (ref 37.5–51.0)
Hemoglobin: 15 g/dL (ref 13.0–17.7)
Immature Grans (Abs): 0 10*3/uL (ref 0.0–0.1)
Immature Granulocytes: 0 %
Iron: 86 ug/dL (ref 38–169)
LDH: 137 IU/L (ref 121–224)
LDL Chol Calc (NIH): 80 mg/dL (ref 0–99)
Lymphocytes Absolute: 2.1 10*3/uL (ref 0.7–3.1)
Lymphs: 42 %
MCH: 30.8 pg (ref 26.6–33.0)
MCHC: 34.6 g/dL (ref 31.5–35.7)
MCV: 89 fL (ref 79–97)
Monocytes Absolute: 0.4 10*3/uL (ref 0.1–0.9)
Monocytes: 8 %
Neutrophils Absolute: 2.5 10*3/uL (ref 1.4–7.0)
Neutrophils: 47 %
Phosphorus: 2.9 mg/dL (ref 2.8–4.1)
Platelets: 265 10*3/uL (ref 150–450)
Potassium: 4.7 mmol/L (ref 3.5–5.2)
RBC: 4.87 x10E6/uL (ref 4.14–5.80)
RDW: 12.3 % (ref 11.6–15.4)
Sodium: 139 mmol/L (ref 134–144)
T3 Uptake Ratio: 30 % (ref 24–39)
T4, Total: 8.2 ug/dL (ref 4.5–12.0)
TSH: 1.17 u[IU]/mL (ref 0.450–4.500)
Total Protein: 7.3 g/dL (ref 6.0–8.5)
Triglycerides: 62 mg/dL (ref 0–149)
Uric Acid: 6.1 mg/dL (ref 3.8–8.4)
VLDL Cholesterol Cal: 13 mg/dL (ref 5–40)
WBC: 5.2 10*3/uL (ref 3.4–10.8)

## 2019-06-25 ENCOUNTER — Other Ambulatory Visit: Payer: Self-pay

## 2019-06-25 ENCOUNTER — Ambulatory Visit: Payer: Self-pay | Admitting: Physician Assistant

## 2019-06-25 ENCOUNTER — Encounter: Payer: Self-pay | Admitting: Physician Assistant

## 2019-06-25 VITALS — BP 110/60 | HR 66 | Temp 98.8°F | Resp 12 | Ht 68.0 in | Wt 144.0 lb

## 2019-06-25 DIAGNOSIS — Z0000 Encounter for general adult medical examination without abnormal findings: Secondary | ICD-10-CM

## 2019-06-25 NOTE — Progress Notes (Signed)
   Subjective: Physical exam    Patient ID: Carl Krueger, male    DOB: [birth year], 24 y.o.   MRN: [id number]  HPI Patient presents for annual physical exam.  Patient voices no concerns or complaints.   Review of Systems    Negative Objective:   Physical Exam HEENT is unremarkable.  Neck is supple for adenopathy or bruits.  Lungs are clear to auscultation.  Heart regular rate and rhythm.  Abdomen with negative HSM, normoactive bowel sounds, soft nontender palpation.  No obvious deformity to the upper or lower extremities.  Patient is full equal range of motion of the upper and lower extremities.  No obvious deformities to the cervical or lumbar spine.  Patient has full equal range of motion cervical lumbar spine.  Cranial nerves II through XII are grossly intact.       Assessment & Plan: Well exam  No acute findings on physical exam.  Discussed no acute findings for labs.  Follow-up as necessary.

## 2019-06-29 DIAGNOSIS — R05 Cough: Secondary | ICD-10-CM | POA: Diagnosis not present

## 2019-06-29 DIAGNOSIS — R0602 Shortness of breath: Secondary | ICD-10-CM | POA: Diagnosis not present

## 2019-06-29 DIAGNOSIS — Z20828 Contact with and (suspected) exposure to other viral communicable diseases: Secondary | ICD-10-CM | POA: Diagnosis not present

## 2019-11-06 ENCOUNTER — Ambulatory Visit: Payer: 59 | Admitting: Emergency Medicine

## 2019-11-06 ENCOUNTER — Encounter: Payer: Self-pay | Admitting: Emergency Medicine

## 2019-11-06 ENCOUNTER — Other Ambulatory Visit: Payer: Self-pay

## 2019-11-06 VITALS — BP 122/74 | HR 85 | Temp 98.7°F | Resp 14 | Ht 68.0 in | Wt 140.0 lb

## 2019-11-06 DIAGNOSIS — H60332 Swimmer's ear, left ear: Secondary | ICD-10-CM

## 2019-11-06 MED ORDER — NEOMYCIN-POLYMYXIN-HC 3.5-10000-1 OT SOLN: 4.0000 [drp] | Freq: Four times a day (QID) | OTIC | 0 refills | Status: DC

## 2019-11-06 NOTE — Progress Notes (Signed)
  Occupational Health Provider Note       Time seen: 1:18 PM    I have reviewed the vital signs and the nursing notes.  HISTORY   Chief Complaint Ear Pain    HPI Carl Krueger is a 24 y.o. male with no significant medical history who presents today for left ear pain for about a week. States clear fluid has drained out of the ear. Denies any other complaints  Past Medical History:  Diagnosis Date  . Abdominal pain   . Constipation   . Nausea     Past Surgical History:  Procedure Laterality Date  . EUSTACHIAN TUBE DILATION    . TONSILLECTOMY      Allergies Patient has no known allergies.   Review of Systems Constitutional: Negative for fever. HEENT: positive for left ear pain Musculoskeletal: Negative for back pain. Skin: Negative for rash. Neurological: Negative for headaches, focal weakness or numbness.  All systems negative/normal/unremarkable except as stated in the HPI  ____________________________________________   PHYSICAL EXAM:  VITAL SIGNS: Vitals:   11/06/19 1314  BP: 122/74  Pulse: 85  Resp: 14  Temp: 98.7 F (37.1 C)  SpO2: 97%    Constitutional: Alert and oriented. Well appearing and in no distress. Eyes: Conjunctivae are normal. Normal extraocular movements. ENT      Head: Normocephalic and atraumatic.      Ears: debris and inflammation in the left ear canal, pain with manipulation of the pinna, TM unremarkable. Right TM normal      Nose: No congestion/rhinnorhea.      Mouth/Throat: Mucous membranes are moist.      Neck: No stridor. Musculoskeletal: Nontender with normal range of motion in extremities. No lower extremity tenderness nor edema. Neurologic:  Normal speech and language. No gross focal neurologic deficits are appreciated.  Skin:  Skin is warm, dry and intact. No rash noted. Psychiatric: Speech and behavior are normal.  ____________________________________________   LABS (pertinent positives/negatives)  No  results found for this or any previous visit (from the past 2160 hour(s)).   ASSESSMENT AND PLAN  Otitis Externa   Plan: The patient had presented for left ear pain. Patient will be treated with steroid and antibiotic ear drops. Is cleared for follow up as needed.    Daryel November MD    Note: This note was generated in part or whole with voice recognition software. Voice recognition is usually quite accurate but there are transcription errors that can and very often do occur. I apologize for any typographical errors that were not detected and corrected.

## 2019-11-06 NOTE — Progress Notes (Signed)
Pt presents today with left ear pain for about a week. Pt states clear fluid has drained out of his ear. Pt states also to that two weeks ago he was on vacation swimming and it started hurting a week after he came back. CL,RMA

## 2019-12-15 ENCOUNTER — Other Ambulatory Visit: Payer: Self-pay

## 2019-12-15 DIAGNOSIS — Z1152 Encounter for screening for COVID-19: Secondary | ICD-10-CM

## 2019-12-15 NOTE — Progress Notes (Signed)
Covid testing - exposure - spouse positive -  Non vax.

## 2019-12-17 LAB — NOVEL CORONAVIRUS, NAA: SARS-CoV-2, NAA: NOT DETECTED

## 2019-12-17 LAB — SARS-COV-2, NAA 2 DAY TAT

## 2020-03-02 ENCOUNTER — Telehealth: Payer: Self-pay | Admitting: Nurse Practitioner

## 2020-03-02 ENCOUNTER — Other Ambulatory Visit: Payer: Self-pay

## 2020-03-02 DIAGNOSIS — J209 Acute bronchitis, unspecified: Secondary | ICD-10-CM

## 2020-03-02 DIAGNOSIS — Z1152 Encounter for screening for COVID-19: Secondary | ICD-10-CM

## 2020-03-02 MED ORDER — BENZONATATE 200 MG PO CAPS: 200.0000 mg | ORAL_CAPSULE | Freq: Three times a day (TID) | ORAL | 0 refills | Status: DC | PRN

## 2020-03-02 MED ORDER — AZITHROMYCIN 250 MG PO TABS: ORAL_TABLET | ORAL | 0 refills | Status: DC

## 2020-03-02 MED ORDER — ALBUTEROL SULFATE HFA 108 (90 BASE) MCG/ACT IN AERS: 2.0000 | INHALATION_SPRAY | Freq: Four times a day (QID) | RESPIRATORY_TRACT | 0 refills | Status: DC | PRN

## 2020-03-02 NOTE — Progress Notes (Signed)
   Subjective:    Patient ID: Carl Krueger, male    DOB: October 11, 1995, 24 y.o.   MRN: [id number]  HPI  24 year old male presenting for virtual appointment today after COVID testing with nurse.  When he takes a deep breath his chest burns and he has a dry cough  No fever Slight nasal congestion No asthma Hx of bronchitis  Currently taking nothing OTC  Cough is worse with activity  Some tightness with breathing, no chest pain or pain into extremities Able to do his job without SOB does not feel like he could run without coughing.   Symptoms present for about a week now.    Review of Systems  Constitutional: Negative.   HENT: Positive for congestion and rhinorrhea.   Eyes: Negative.   Respiratory: Positive for cough.   Cardiovascular: Negative.   Musculoskeletal: Negative.   Neurological: Negative.        Objective:   Physical Exam This was a telehealth appointment after nurse visit for COVID-19 testing.   No acute distress during conversation, patient is able to hold a conversation without SOB dry cough witnessed        Assessment & Plan:  Await COVID-19 results- sent to lab today Treatment for bronchitis secondary to URI Meds ordered this encounter  Medications  . azithromycin (ZITHROMAX) 250 MG tablet    Sig: Take 2 tablets on day 1, then one tablet daily on days 2-5. Take with food    Dispense:  6 tablet    Refill:  0  . benzonatate (TESSALON) 200 MG capsule    Sig: Take 1 capsule (200 mg total) by mouth 3 (three) times daily as needed for cough.    Dispense:  20 capsule    Refill:  0  . albuterol (VENTOLIN HFA) 108 (90 Base) MCG/ACT inhaler    Sig: Inhale 2 puffs into the lungs every 6 (six) hours as needed for wheezing or shortness of breath.    Dispense:  8 g    Refill:  0   Will follow up with COVID results when available, RTC as needed if symptoms persist/worsen or with new concerns as discussed.

## 2020-03-03 LAB — NOVEL CORONAVIRUS, NAA: SARS-CoV-2, NAA: NOT DETECTED

## 2020-03-03 LAB — SARS-COV-2, NAA 2 DAY TAT

## 2020-07-16 ENCOUNTER — Other Ambulatory Visit: Payer: Self-pay

## 2020-07-16 ENCOUNTER — Ambulatory Visit: Payer: Self-pay

## 2020-07-16 VITALS — BP 119/77 | Temp 97.6°F | Resp 14 | Ht 68.0 in | Wt 145.0 lb

## 2020-07-16 DIAGNOSIS — R110 Nausea: Secondary | ICD-10-CM

## 2020-07-16 DIAGNOSIS — K5900 Constipation, unspecified: Secondary | ICD-10-CM

## 2020-07-16 MED ORDER — ONDANSETRON HCL 4 MG PO TABS: 4.0000 mg | ORAL_TABLET | Freq: Three times a day (TID) | ORAL | 0 refills | Status: DC | PRN

## 2020-07-16 NOTE — Progress Notes (Signed)
[facility] [address] [place], [place] [address]   Office Visit Note  Patient Name: Carl Krueger  [id number]  [id number]  Date of Service: 07/16/2020  Chief Complaint  Patient presents with  . lump on lower left abd    First noticed lump 6 months ago. Pt denies any pain and doesn't bother him at all. CL,RMA     HPI Pt is here for a sick visit. He reports a lump on the left side of his umbilicus.  He reports seeing this bulge when looking down at times.  Often when he showers at night.  He has no pain, discomfort, or tenderness.  He reports a long standing history of intermittent constipation. He typically manages this with Miralax.  He often gets nausea with this.  He takes Zofran when the nausea persists, and he is requesting a refill at this time.      Current Medication:  Outpatient Encounter Medications as of 07/16/2020  Medication Sig  . cetirizine (ZYRTEC) 10 MG tablet Take 10 mg by mouth daily.  . ondansetron (ZOFRAN) 4 MG tablet Take 1 tablet (4 mg total) by mouth every 8 (eight) hours as needed for nausea or vomiting.  . [DISCONTINUED] albuterol (VENTOLIN HFA) 108 (90 Base) MCG/ACT inhaler Inhale 2 puffs into the lungs every 6 (six) hours as needed for wheezing or shortness of breath.  . [DISCONTINUED] azithromycin (ZITHROMAX) 250 MG tablet Take 2 tablets on day 1, then one tablet daily on days 2-5. Take with food  . [DISCONTINUED] benzonatate (TESSALON) 200 MG capsule Take 1 capsule (200 mg total) by mouth 3 (three) times daily as needed for cough.   No facility-administered encounter medications on file as of 07/16/2020.      Medical History: Past Medical History:  Diagnosis Date  . Abdominal pain   . Constipation   . Nausea      Vital Signs: BP 119/77   Temp 97.6 F (36.4 C)   Resp 14   Ht 5\' 8"  (1.727 m)   Wt 145 lb (65.8 kg)   SpO2 97%   BMI 22.05 kg/m    Review of Systems  Gastrointestinal: Negative for abdominal distention,  abdominal pain, blood in stool, constipation, diarrhea, nausea and vomiting.       Bulge left of umbilicus  Skin: Negative for color change.    Physical Exam Abdominal:     General: Abdomen is flat. Bowel sounds are normal. There is no distension.     Palpations: Abdomen is soft.     Tenderness: There is no abdominal tenderness. There is no guarding.     Hernia: No hernia is present.     Comments: No mass or bulging at site where patient is concerned.  He reports it is intermittent.     Assessment/Plan: 1. Constipation, unspecified constipation type No mass found on exam. Discussed Lipomas, and hernias.  Discussed signs and symptoms of hernia, and when to seek treatment.  Continue to use Miralax as needed for constipation.    2. Nausea without vomiting Use zofran as discussed.  Refill sent to pharmacy.  - ondansetron (ZOFRAN) 4 MG tablet; Take 1 tablet (4 mg total) by mouth every 8 (eight) hours as needed for nausea or vomiting.  Dispense: 20 tablet; Refill: 0      General Counseling: Coner verbalizes understanding of the findings of todays visit and agrees with plan of treatment. I have discussed any further diagnostic evaluation that may be needed or ordered  today. We also reviewed his medications today. he has been encouraged to call the office with any questions or concerns that should arise related to todays visit.   No orders of the defined types were placed in this encounter.   Meds ordered this encounter  Medications  . ondansetron (ZOFRAN) 4 MG tablet    Sig: Take 1 tablet (4 mg total) by mouth every 8 (eight) hours as needed for nausea or vomiting.    Dispense:  20 tablet    Refill:  0    Time spent:30 Minutes    Johnna Acosta AGNP-C Nurse Practitioner

## 2020-08-31 ENCOUNTER — Ambulatory Visit: Payer: Self-pay

## 2020-08-31 ENCOUNTER — Other Ambulatory Visit: Payer: Self-pay

## 2020-08-31 DIAGNOSIS — Z0000 Encounter for general adult medical examination without abnormal findings: Secondary | ICD-10-CM

## 2020-08-31 NOTE — Progress Notes (Signed)
Reviewed CDC recommendations for importance of HIV/ Hep C screening once in lifetime. Patient has declined HIV / Hep C screenings today and will let us know if they should change their mind in the future. CL,RMA 

## 2020-08-31 NOTE — Progress Notes (Signed)
Scheduled to complete physical 09/09/20 with Adam Scarboro, NP-C.  AMD 

## 2020-09-01 LAB — CMP12+LP+TP+TSH+6AC+CBC/D/PLT
ALT: 10 IU/L (ref 0–44)
AST: 15 IU/L (ref 0–40)
Albumin/Globulin Ratio: 2 (ref 1.2–2.2)
Albumin: 4.7 g/dL (ref 4.1–5.2)
Alkaline Phosphatase: 68 IU/L (ref 44–121)
BUN/Creatinine Ratio: 10 (ref 9–20)
BUN: 10 mg/dL (ref 6–20)
Basophils Absolute: 0 10*3/uL (ref 0.0–0.2)
Basos: 0 %
Bilirubin Total: 0.6 mg/dL (ref 0.0–1.2)
Calcium: 10 mg/dL (ref 8.7–10.2)
Chloride: 102 mmol/L (ref 96–106)
Chol/HDL Ratio: 2.9 ratio (ref 0.0–5.0)
Cholesterol, Total: 160 mg/dL (ref 100–199)
Creatinine, Ser: 1.05 mg/dL (ref 0.76–1.27)
EOS (ABSOLUTE): 0.2 10*3/uL (ref 0.0–0.4)
Eos: 3 %
Estimated CHD Risk: <0.5 times avg. (ref 0.0–1.0)
Free Thyroxine Index: 2.3 (ref 1.2–4.9)
GGT: 11 IU/L (ref 0–65)
Globulin, Total: 2.4 g/dL (ref 1.5–4.5)
Glucose: 84 mg/dL (ref 65–99)
HDL: 56 mg/dL (ref >39)
Hematocrit: 45.8 % (ref 37.5–51.0)
Hemoglobin: 15.8 g/dL (ref 13.0–17.7)
Immature Grans (Abs): 0 10*3/uL (ref 0.0–0.1)
Immature Granulocytes: 0 %
Iron: 131 ug/dL (ref 38–169)
LDH: 157 IU/L (ref 121–224)
LDL Chol Calc (NIH): 89 mg/dL (ref 0–99)
Lymphocytes Absolute: 2.4 10*3/uL (ref 0.7–3.1)
Lymphs: 44 %
MCH: 30.4 pg (ref 26.6–33.0)
MCHC: 34.5 g/dL (ref 31.5–35.7)
MCV: 88 fL (ref 79–97)
Monocytes Absolute: 0.4 10*3/uL (ref 0.1–0.9)
Monocytes: 8 %
Neutrophils Absolute: 2.4 10*3/uL (ref 1.4–7.0)
Neutrophils: 45 %
Phosphorus: 2.9 mg/dL (ref 2.8–4.1)
Platelets: 251 10*3/uL (ref 150–450)
Potassium: 4.4 mmol/L (ref 3.5–5.2)
RBC: 5.2 x10E6/uL (ref 4.14–5.80)
RDW: 11.8 % (ref 11.6–15.4)
Sodium: 143 mmol/L (ref 134–144)
T3 Uptake Ratio: 29 % (ref 24–39)
T4, Total: 7.9 ug/dL (ref 4.5–12.0)
TSH: 0.957 u[IU]/mL (ref 0.450–4.500)
Total Protein: 7.1 g/dL (ref 6.0–8.5)
Triglycerides: 82 mg/dL (ref 0–149)
Uric Acid: 6.3 mg/dL (ref 3.8–8.4)
VLDL Cholesterol Cal: 15 mg/dL (ref 5–40)
WBC: 5.5 10*3/uL (ref 3.4–10.8)
eGFR: 102 mL/min/{1.73_m2} (ref >59)

## 2020-09-09 ENCOUNTER — Ambulatory Visit: Payer: Self-pay | Admitting: Adult Health

## 2020-09-09 ENCOUNTER — Encounter: Payer: Self-pay | Admitting: Adult Health

## 2020-09-09 ENCOUNTER — Other Ambulatory Visit: Payer: Self-pay

## 2020-09-09 VITALS — BP 117/69 | HR 108 | Temp 100.2°F | Resp 12 | Ht 68.0 in | Wt 145.0 lb

## 2020-09-09 DIAGNOSIS — Z0000 Encounter for general adult medical examination without abnormal findings: Secondary | ICD-10-CM

## 2020-09-09 DIAGNOSIS — R110 Nausea: Secondary | ICD-10-CM

## 2020-09-09 DIAGNOSIS — K5900 Constipation, unspecified: Secondary | ICD-10-CM

## 2020-09-09 LAB — POCT URINALYSIS DIPSTICK
Bilirubin, UA: NEGATIVE
Blood, UA: NEGATIVE
Glucose, UA: NEGATIVE
Ketones, UA: NEGATIVE
Leukocytes, UA: NEGATIVE
Nitrite, UA: NEGATIVE
Protein, UA: NEGATIVE
Spec Grav, UA: 1.015 (ref 1.010–1.025)
Urobilinogen, UA: 0.2 E.U./dL
pH, UA: 7 (ref 5.0–8.0)

## 2020-09-09 NOTE — Progress Notes (Signed)
[facility] [place], [place] [address]  Internal MEDICINE  Office Visit Note  Patient Name: Carl Krueger  [id number]  [id number]  Date of Service: 09/09/2020  Chief Complaint  Patient presents with   Annual Exam    Pt denies any issues or concerns at this time.     HPI Pt is here for routine health maintenance examination.  He is a well appearing 25 yo male who works in [employer] for the past 3 years.  He is engaged and has a 76 yo daughter.  His history is remarkable for a long standing history of intermittent constipation. He typically manages this with Miralax.  He often gets nausea with this and uses Zofran occasionally.    Current Medication: Outpatient Encounter Medications as of 09/09/2020  Medication Sig   loratadine (CLARITIN) 10 MG tablet Take 10 mg by mouth daily as needed for allergies.   ondansetron (ZOFRAN) 4 MG tablet Take 1 tablet (4 mg total) by mouth every 8 (eight) hours as needed for nausea or vomiting.   [DISCONTINUED] cetirizine (ZYRTEC) 10 MG tablet Take 10 mg by mouth daily.   No facility-administered encounter medications on file as of 09/09/2020.    Surgical History: Past Surgical History:  Procedure Laterality Date   EUSTACHIAN TUBE DILATION     TONSILLECTOMY      Medical History: Past Medical History:  Diagnosis Date   Abdominal pain    Constipation    Nausea     Family History: Family History  Problem Relation Age of Onset   Diabetes Mother    Hypertension Mother    Constipation Mother    Hypertension Father    Hypertension Sister    Hypertension Brother    Diabetes Maternal Grandmother    Lung cancer Maternal Grandfather    Diabetes Paternal Grandmother    Headache Sister    Seizures Brother     Social History: Social History   Socioeconomic History   Marital status: Single    Spouse name: Not on file   Number of children: Not on file   Years of education: Not on file   Highest  education level: Not on file  Occupational History   Not on file  Tobacco Use   Smoking status: Former    Pack years: 0.00    Types: E-cigarettes    Quit date: 10/06/2019    Years since quitting: 0.9   Smokeless tobacco: Never  Vaping Use   Vaping Use: Never used  Substance and Sexual Activity   Alcohol use: No   Drug use: No   Sexual activity: Not on file  Other Topics Concern   Not on file  Social History Narrative   Not on file   Social Determinants of Health   Financial Resource Strain: Not on file  Food Insecurity: Not on file  Transportation Needs: Not on file  Physical Activity: Not on file  Stress: Not on file  Social Connections: Not on file      Review of Systems  Constitutional:  Negative for activity change, appetite change and fatigue.  HENT:  Negative for congestion, sinus pain, trouble swallowing and voice change.   Eyes:  Negative for pain, discharge and visual disturbance.  Respiratory:  Negative for cough, chest tightness and shortness of breath.   Cardiovascular:  Negative for chest pain and leg swelling.  Gastrointestinal:  Negative for abdominal distention, abdominal pain, constipation and diarrhea.  Endocrine: Negative for cold intolerance  and heat intolerance.  Genitourinary:  Negative for difficulty urinating, flank pain, frequency, scrotal swelling and testicular pain.  Musculoskeletal:  Negative for arthralgias, back pain and neck pain.  Skin:  Negative for color change.  Neurological:  Negative for dizziness, weakness and headaches.  Hematological:  Negative for adenopathy.  Psychiatric/Behavioral:  Negative for agitation, confusion and suicidal ideas.     Vital Signs: BP 117/69   Pulse (!) 108   Temp 100.2 F (37.9 C)   Resp 12   Ht '5\' 8"'  (1.727 m)   Wt 145 lb (65.8 kg)   SpO2 96%   BMI 22.05 kg/m    Physical Exam Constitutional:      Appearance: Normal appearance.  HENT:     Head: Normocephalic.     Right Ear: Tympanic  membrane normal.     Left Ear: Tympanic membrane normal.     Nose: Nose normal.     Mouth/Throat:     Mouth: Mucous membranes are moist.     Pharynx: No oropharyngeal exudate or posterior oropharyngeal erythema.  Eyes:     General:        Right eye: No discharge.        Left eye: No discharge.     Extraocular Movements: Extraocular movements intact.     Pupils: Pupils are equal, round, and reactive to light.  Cardiovascular:     Rate and Rhythm: Normal rate and regular rhythm.     Pulses: Normal pulses.     Heart sounds: Normal heart sounds. No murmur heard. Pulmonary:     Effort: Pulmonary effort is normal. No respiratory distress.     Breath sounds: Normal breath sounds. No wheezing or rhonchi.  Abdominal:     General: Abdomen is flat. Bowel sounds are normal. There is no distension.     Palpations: There is no mass.     Tenderness: There is no abdominal tenderness. There is no guarding.     Hernia: No hernia is present. There is no hernia in the left inguinal area or right inguinal area.  Genitourinary:    Penis: Normal and circumcised. No phimosis, hypospadias, tenderness or swelling.      Testes:        Right: Mass, tenderness, swelling, testicular hydrocele or varicocele not present. Right testis is descended.        Left: Mass, tenderness, swelling, testicular hydrocele or varicocele not present. Left testis is descended.     Epididymis:     Right: Not inflamed or enlarged. No mass or tenderness.     Left: Not inflamed or enlarged. No mass or tenderness.  Musculoskeletal:        General: No swelling or deformity. Normal range of motion.     Cervical back: Normal range of motion.  Lymphadenopathy:     Lower Body: No right inguinal adenopathy. No left inguinal adenopathy.  Skin:    General: Skin is warm and dry.     Capillary Refill: Capillary refill takes less than 2 seconds.  Neurological:     General: No focal deficit present.     Mental Status: He is alert.      Cranial Nerves: No cranial nerve deficit.     Gait: Gait normal.  Psychiatric:        Mood and Affect: Mood normal.        Behavior: Behavior normal.        Judgment: Judgment normal.     LABS: Recent Results (from the past 2160  hour(s))  CMP12+LP+TP+TSH+6AC+CBC/D/Plt     Status: None   Collection Time: 08/31/20  8:32 AM  Result Value Ref Range   Glucose 84 65 - 99 mg/dL   Uric Acid 6.3 3.8 - 8.4 mg/dL    Comment:            Therapeutic target for gout patients: <6.0   BUN 10 6 - 20 mg/dL   Creatinine, Ser 1.05 0.76 - 1.27 mg/dL   eGFR 102 >59 mL/min/1.73   BUN/Creatinine Ratio 10 9 - 20   Sodium 143 134 - 144 mmol/L   Potassium 4.4 3.5 - 5.2 mmol/L   Chloride 102 96 - 106 mmol/L   Calcium 10.0 8.7 - 10.2 mg/dL   Phosphorus 2.9 2.8 - 4.1 mg/dL   Total Protein 7.1 6.0 - 8.5 g/dL   Albumin 4.7 4.1 - 5.2 g/dL   Globulin, Total 2.4 1.5 - 4.5 g/dL   Albumin/Globulin Ratio 2.0 1.2 - 2.2   Bilirubin Total 0.6 0.0 - 1.2 mg/dL   Alkaline Phosphatase 68 44 - 121 IU/L   LDH 157 121 - 224 IU/L   AST 15 0 - 40 IU/L   ALT 10 0 - 44 IU/L   GGT 11 0 - 65 IU/L   Iron 131 38 - 169 ug/dL   Cholesterol, Total 160 100 - 199 mg/dL   Triglycerides 82 0 - 149 mg/dL   HDL 56 >39 mg/dL   VLDL Cholesterol Cal 15 5 - 40 mg/dL   LDL Chol Calc (NIH) 89 0 - 99 mg/dL   Chol/HDL Ratio 2.9 0.0 - 5.0 ratio    Comment:                                   T. Chol/HDL Ratio                                             Men  Women                               1/2 Avg.Risk  3.4    3.3                                   Avg.Risk  5.0    4.4                                2X Avg.Risk  9.6    7.1                                3X Avg.Risk 23.4   11.0    Estimated CHD Risk  < 0.5 0.0 - 1.0 times avg.    Comment: The CHD Risk is based on the T. Chol/HDL ratio. Other factors affect CHD Risk such as hypertension, smoking, diabetes, severe obesity, and family history of premature CHD.    TSH 0.957 0.450 - 4.500  uIU/mL   T4, Total 7.9 4.5 - 12.0 ug/dL   T3 Uptake Ratio 29 24 - 39 %   Free Thyroxine Index 2.3  1.2 - 4.9   WBC 5.5 3.4 - 10.8 x10E3/uL   RBC 5.20 4.14 - 5.80 x10E6/uL   Hemoglobin 15.8 13.0 - 17.7 g/dL   Hematocrit 45.8 37.5 - 51.0 %   MCV 88 79 - 97 fL   MCH 30.4 26.6 - 33.0 pg   MCHC 34.5 31.5 - 35.7 g/dL   RDW 11.8 11.6 - 15.4 %   Platelets 251 150 - 450 x10E3/uL   Neutrophils 45 Not Estab. %   Lymphs 44 Not Estab. %   Monocytes 8 Not Estab. %   Eos 3 Not Estab. %   Basos 0 Not Estab. %   Neutrophils Absolute 2.4 1.4 - 7.0 x10E3/uL   Lymphocytes Absolute 2.4 0.7 - 3.1 x10E3/uL   Monocytes Absolute 0.4 0.1 - 0.9 x10E3/uL   EOS (ABSOLUTE) 0.2 0.0 - 0.4 x10E3/uL   Basophils Absolute 0.0 0.0 - 0.2 x10E3/uL   Immature Granulocytes 0 Not Estab. %   Immature Grans (Abs) 0.0 0.0 - 0.1 x10E3/uL  POCT urinalysis dipstick     Status: Normal   Collection Time: 09/09/20  2:32 PM  Result Value Ref Range   Color, UA yellow    Clarity, UA clear    Glucose, UA Negative Negative   Bilirubin, UA negative    Ketones, UA negative    Spec Grav, UA 1.015 1.010 - 1.025   Blood, UA negative    pH, UA 7.0 5.0 - 8.0   Protein, UA Negative Negative   Urobilinogen, UA 0.2 0.2 or 1.0 E.U./dL   Nitrite, UA negative    Leukocytes, UA Negative Negative   Appearance light    Odor       Assessment/Plan: 1. Routine adult health maintenance Up to date on PHM.  - POCT urinalysis dipstick  2. Constipation, unspecified constipation type Continue current management with Miralax PRN.   3. Nausea Continue to use Zofran PRN.      General Counseling: ngai parcell understanding of the findings of todays visit and agrees with plan of treatment. I have discussed any further diagnostic evaluation that may be needed or ordered today. We also reviewed his medications today. he has been encouraged to call the office with any questions or concerns that should arise related to todays  visit.    Orders Placed This Encounter  Procedures   POCT urinalysis dipstick    No orders of the defined types were placed in this encounter.   Total time spent:40 Minutes  Time spent includes review of chart, medications, test results, and follow up plan with the patient.    Kendell Bane AGNP-C Nurse Practitioner

## 2020-09-09 NOTE — Progress Notes (Signed)
Reviewed CDC recommendations for importance of the COVID-19 Vaccine. Pt has declined the vaccine today and for the future.  

## 2021-02-07 DIAGNOSIS — Z20822 Contact with and (suspected) exposure to covid-19: Secondary | ICD-10-CM | POA: Diagnosis not present

## 2021-02-07 DIAGNOSIS — J029 Acute pharyngitis, unspecified: Secondary | ICD-10-CM | POA: Diagnosis not present

## 2021-04-05 DIAGNOSIS — R062 Wheezing: Secondary | ICD-10-CM | POA: Diagnosis not present

## 2021-04-05 DIAGNOSIS — R0602 Shortness of breath: Secondary | ICD-10-CM | POA: Diagnosis not present

## 2021-04-05 DIAGNOSIS — R079 Chest pain, unspecified: Secondary | ICD-10-CM | POA: Diagnosis not present

## 2021-04-05 DIAGNOSIS — I2089 Other forms of angina pectoris: Secondary | ICD-10-CM | POA: Insufficient documentation

## 2021-04-05 DIAGNOSIS — I208 Other forms of angina pectoris: Secondary | ICD-10-CM | POA: Diagnosis not present

## 2021-04-25 DIAGNOSIS — I208 Other forms of angina pectoris: Secondary | ICD-10-CM | POA: Diagnosis not present

## 2021-05-02 DIAGNOSIS — I208 Other forms of angina pectoris: Secondary | ICD-10-CM | POA: Diagnosis not present

## 2021-06-06 ENCOUNTER — Other Ambulatory Visit: Payer: Self-pay

## 2021-06-06 ENCOUNTER — Encounter: Payer: Self-pay | Admitting: Physician Assistant

## 2021-06-06 ENCOUNTER — Ambulatory Visit: Payer: Self-pay | Admitting: Physician Assistant

## 2021-06-06 VITALS — BP 104/81 | HR 75 | Temp 99.3°F | Resp 14 | Ht 68.0 in | Wt 145.0 lb

## 2021-06-06 DIAGNOSIS — F411 Generalized anxiety disorder: Secondary | ICD-10-CM

## 2021-06-06 MED ORDER — PAROXETINE HCL 20 MG PO TABS: 20.0000 mg | ORAL_TABLET | Freq: Every day | ORAL | 0 refills | Status: DC

## 2021-06-06 NOTE — Progress Notes (Signed)
Pt has been experiencing anxiety for the past couple years w/o being dx with it. Here lately (past month) pt states its gotten worse.  ?

## 2021-06-06 NOTE — Progress Notes (Signed)
? ?  Subjective: Anxiety  ? ? Patient ID: Carl Krueger, male    DOB: Oct 30, 1995, 26 y.o.   MRN: [id number] ? ?HPI ?Patient states increased anxiety in the past month.  Patient states for few years of anxiety but has increased in the past 1/2 months.  Patient states her anxiety becomes worse mid afternoon.  Patient states anxiety appears to be work-related but he cannot pinpoint the exact trigger.  Denies panic attacks. ? ? ?Review of Systems ?Negative except for chief complaint ?   ?Objective:  ? Physical Exam ? ?Temperature is 99.3, pulse 75, respiration 14, BP is 104/81, patient 95% O2 sat on room air.  Patient weighs on 45 pounds and BMI is 22.05. ?No acute distress.  HEENT is unremarkable.  Neck is supple for lymphadenopathy or bruits.  Lungs are clear to auscultation.  Heart regular rate and rhythm.  Patient scored 11 on the generalized anxiety disorder scale. ? ? ?   ?Assessment & Plan: Anxiety  ?Patient amenable to starting Paxil at 20 mg daily for 1 to 2-week follow-up. ? ?

## 2021-06-07 ENCOUNTER — Ambulatory Visit: Payer: 59 | Admitting: Physician Assistant

## 2021-06-09 ENCOUNTER — Ambulatory Visit (INDEPENDENT_AMBULATORY_CARE_PROVIDER_SITE_OTHER): Payer: 59 | Admitting: Nurse Practitioner

## 2021-06-09 ENCOUNTER — Other Ambulatory Visit: Payer: Self-pay

## 2021-06-09 ENCOUNTER — Encounter: Payer: Self-pay | Admitting: Nurse Practitioner

## 2021-06-09 VITALS — BP 128/63 | HR 54 | Ht 68.0 in | Wt 141.1 lb

## 2021-06-09 DIAGNOSIS — R110 Nausea: Secondary | ICD-10-CM

## 2021-06-09 DIAGNOSIS — F419 Anxiety disorder, unspecified: Secondary | ICD-10-CM | POA: Diagnosis not present

## 2021-06-09 DIAGNOSIS — Z7689 Persons encountering health services in other specified circumstances: Secondary | ICD-10-CM | POA: Diagnosis not present

## 2021-06-09 DIAGNOSIS — R69 Illness, unspecified: Secondary | ICD-10-CM | POA: Diagnosis not present

## 2021-06-09 NOTE — Progress Notes (Signed)
? ?New Patient Office Visit ? ?Subjective:  ?Patient ID: Carl Krueger, male    DOB: Feb 12, 1996  Age: 26 y.o. MRN: [id number] ? ?CC:  ?Chief Complaint  ?Patient presents with  ? New Patient (Initial Visit)  ? ? ?HPI ?Carl Krueger presents for establishing care. 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  ? ?Past Medical History:  ?Diagnosis Date  ? Abdominal pain   ? Constipation   ? Nausea   ? ? ?Past Surgical History:  ?Procedure Laterality Date  ? EUSTACHIAN TUBE DILATION    ? TONSILLECTOMY    ? ? ?Family History  ?Problem Relation Age of Onset  ? Diabetes Mother   ? Hypertension Mother   ? Constipation Mother   ? Hypertension Father   ? Hypertension Sister   ? Headache Sister   ? Hypertension Brother   ? Seizures Brother   ? Diabetes Maternal Grandmother   ? Lung cancer Maternal Grandfather   ? Diabetes Paternal Grandmother   ? ? ?Social History  ? ?Socioeconomic History  ? Marital status: Single  ?  Spouse name: Not on file  ? Number of children: 1  ? Years of education: Not on file  ? Highest education level: High school graduate  ?Occupational History  ? Not on file  ?Tobacco Use  ? Smoking status: Former  ?  Types: E-cigarettes  ?  Quit date: 10/06/2019  ?  Years since quitting: 1.6  ? Smokeless tobacco: Never  ?Vaping Use  ? Vaping Use: Never used  ?Substance and Sexual Activity  ? Alcohol use: No  ? Drug use: No  ? Sexual activity: Yes  ?  Birth control/protection: None  ?Other Topics Concern  ? Not on file  ?Social History Narrative  ? Not on file  ? ?Social Determinants of Health  ? ?Financial Resource Strain: Not on file  ?Food Insecurity: Not on file  ?Transportation Needs: Not on file  ?Physical Activity: Not on file  ?Stress: Not on file  ?Social Connections: Not on file  ?Intimate Partner Violence: Not on file  ? ? ?ROS ?Review of Systems  ?Constitutional:  Negative for activity change, appetite change and fatigue.  ?HENT:  Negative for congestion, hearing loss and sneezing.    ?Eyes: Negative.   ?Respiratory:  Negative for choking, chest tightness and shortness of breath.   ?Cardiovascular:  Negative for chest pain and palpitations.  ?Gastrointestinal:  Positive for constipation and nausea. Negative for abdominal pain.  ?Genitourinary: Negative.   ?Musculoskeletal: Negative.   ?Skin: Negative.   ?Neurological:  Negative for dizziness, light-headedness, numbness and headaches.  ?Psychiatric/Behavioral:  Negative for agitation, behavioral problems and confusion.   ? ?Objective:  ? ?Today's Vitals: BP 128/63   Pulse (!) 54   Ht 5\' 8"  (1.727 m)   Wt 141 lb 1.6 oz (64 kg)   BMI 21.45 kg/m?  ? ?Physical Exam ?Constitutional:   ?   Appearance: Normal appearance. He is normal weight.  ?HENT:  ?   Head: Normocephalic.  ?   Right Ear: Tympanic membrane normal.  ?   Left Ear: Tympanic membrane normal.  ?   Nose: Nose normal.  ?   Mouth/Throat:  ?   Mouth: Mucous membranes are moist.  ?   Pharynx: Oropharynx is clear.  ?Eyes:  ?   Extraocular Movements: Extraocular movements intact.  ?   Conjunctiva/sclera: Conjunctivae normal.  ?   Pupils: Pupils are equal, round, and reactive to light.  ?Cardiovascular:  ?  Rate and Rhythm: Normal rate and regular rhythm.  ?   Pulses: Normal pulses.  ?   Heart sounds: Normal heart sounds.  ?Pulmonary:  ?   Effort: Pulmonary effort is normal.  ?   Breath sounds: Normal breath sounds.  ?Abdominal:  ?   General: Abdomen is flat. Bowel sounds are normal.  ?   Palpations: Abdomen is soft.  ?Musculoskeletal:     ?   General: Normal range of motion.  ?   Cervical back: Normal range of motion.  ?Skin: ?   General: Skin is warm.  ?   Capillary Refill: Capillary refill takes less than 2 seconds.  ?Neurological:  ?   General: No focal deficit present.  ?   Mental Status: He is alert and oriented to person, place, and time. Mental status is at baseline.  ?Psychiatric:     ?   Mood and Affect: Mood normal.     ?   Behavior: Behavior normal.     ?   Thought Content:  Thought content normal.     ?   Judgment: Judgment normal.  ? ? ?Assessment & Plan:  ? ?Problem List Items Addressed This Visit   ? ?  ? Other  ? Nausea  ?  Chronic nausea. ?Will send referral to GI. ?  ?  ? Encounter to establish care with new doctor - Primary  ?  Care established. ?  ?  ? Anxiety  ?  Patient was started on paroxetine 20 mg by the provide at [facility] physician on 06/06/21 ?Patient have not started the medication yet.  ?Advice pt to start the medication. ?  ?  ? ? ?Outpatient Encounter Medications as of 06/09/2021  ?Medication Sig  ? ondansetron (ZOFRAN) 4 MG tablet Take 1 tablet (4 mg total) by mouth every 8 (eight) hours as needed for nausea or vomiting.  ? [DISCONTINUED] PARoxetine (PAXIL) 20 MG tablet Take 1 tablet (20 mg total) by mouth daily. (Patient not taking: Reported on 06/09/2021)  ? ?No facility-administered encounter medications on file as of 06/09/2021.  ? ? ?Follow-up: Return in about 2 weeks (around 06/23/2021).  ? ?Kara Dies, NP ? ?

## 2021-06-13 DIAGNOSIS — F419 Anxiety disorder, unspecified: Secondary | ICD-10-CM | POA: Insufficient documentation

## 2021-06-13 NOTE — Assessment & Plan Note (Signed)
Chronic nausea. ?Will send referral to GI. ?

## 2021-06-13 NOTE — Assessment & Plan Note (Addendum)
Patient was started on paroxetine 20 mg by the provide at [facility] physician on 06/06/21 ?Patient have not started the medication yet.  ?Advice pt to start the medication. ?

## 2021-06-13 NOTE — Assessment & Plan Note (Signed)
Care established. ? ?

## 2021-06-20 ENCOUNTER — Ambulatory Visit: Payer: 59 | Admitting: Physician Assistant

## 2021-06-22 NOTE — Progress Notes (Signed)
? ?New Patient Office Visit ? ?Subjective:  ?Patient ID: Carl Krueger, male    DOB: [birth year]  Age: 26 y.o. MRN: [id number] ? ?CC:  ?Chief Complaint  ?Patient presents with  ? Anxiety  ?  Patient is here for follow up on new prescription. Patient states he is not taking the medication.  ? ? ?HPI ?Carl Krueger presents for follow up on anxiety and nausea. Patient states that he took paroxetime for a week but it was giving him nausea, headches and was not feeling fine while taking the medicine. He stopped taking the medicine five days ago. He is feeling much better and would like to be off the medicine and manage the anxiety through life style changes.  ? ?His nausea is better now and had not take ondansetron since a week. His GI appointment is in June. ? ?He complaints of seasonal allergies have tried OTC claritin and zyrtec without much relief.  ? ?  ? ?Past Medical History:  ?Diagnosis Date  ? Abdominal pain   ? Constipation   ? Nausea   ? ? ?Past Surgical History:  ?Procedure Laterality Date  ? EUSTACHIAN TUBE DILATION    ? TONSILLECTOMY    ? ? ?Family History  ?Problem Relation Age of Onset  ? Diabetes Mother   ? Hypertension Mother   ? Constipation Mother   ? Hypertension Father   ? Hypertension Sister   ? Headache Sister   ? Hypertension Brother   ? Seizures Brother   ? Diabetes Maternal Grandmother   ? Lung cancer Maternal Grandfather   ? Diabetes Paternal Grandmother   ? ? ?Social History  ? ?Socioeconomic History  ? Marital status: Single  ?  Spouse name: Not on file  ? Number of children: 1  ? Years of education: Not on file  ? Highest education level: High school graduate  ?Occupational History  ? Not on file  ?Tobacco Use  ? Smoking status: Former  ?  Types: E-cigarettes  ?  Quit date: 10/06/2019  ?  Years since quitting: 1.7  ? Smokeless tobacco: Never  ?Vaping Use  ? Vaping Use: Never used  ?Substance and Sexual Activity  ? Alcohol use: No  ? Drug use: No  ? Sexual activity: Yes  ?  Birth  control/protection: None  ?Other Topics Concern  ? Not on file  ?Social History Narrative  ? Not on file  ? ?Social Determinants of Health  ? ?Financial Resource Strain: Not on file  ?Food Insecurity: Not on file  ?Transportation Needs: Not on file  ?Physical Activity: Not on file  ?Stress: Not on file  ?Social Connections: Not on file  ?Intimate Partner Violence: Not on file  ? ? ?ROS ?Review of Systems  ?Constitutional:  Negative for activity change, appetite change and fatigue.  ?HENT:  Positive for sneezing. Negative for congestion and hearing loss.   ?Eyes: Negative.   ?Respiratory:  Negative for choking, chest tightness and shortness of breath.   ?Cardiovascular:  Negative for chest pain and palpitations.  ?Gastrointestinal:  Positive for constipation. Negative for abdominal pain and nausea.  ?Genitourinary: Negative.   ?Musculoskeletal: Negative.   ?Skin: Negative.   ?Neurological:  Negative for dizziness, light-headedness, numbness and headaches.  ?Psychiatric/Behavioral:  Negative for agitation, behavioral problems and confusion.   ? ?Objective:  ? ?Today's Vitals: BP 121/71   Pulse 68   Ht 5\' 8"  (1.727 m)   Wt 144 lb 12.8 oz (65.7 kg)   BMI 22.02  kg/m?  ? ?Physical Exam ?Constitutional:   ?   Appearance: Normal appearance. He is normal weight.  ?HENT:  ?   Head: Normocephalic.  ?   Right Ear: Tympanic membrane normal.  ?   Left Ear: Tympanic membrane normal.  ?   Nose: Nose normal.  ?   Mouth/Throat:  ?   Mouth: Mucous membranes are moist.  ?   Pharynx: Oropharynx is clear.  ?Eyes:  ?   Extraocular Movements: Extraocular movements intact.  ?   Conjunctiva/sclera: Conjunctivae normal.  ?   Pupils: Pupils are equal, round, and reactive to light.  ?Cardiovascular:  ?   Rate and Rhythm: Normal rate and regular rhythm.  ?   Pulses: Normal pulses.  ?   Heart sounds: Normal heart sounds.  ?Pulmonary:  ?   Effort: Pulmonary effort is normal.  ?   Breath sounds: Normal breath sounds.  ?Abdominal:  ?   General:  Abdomen is flat. Bowel sounds are normal.  ?   Palpations: Abdomen is soft.  ?Musculoskeletal:     ?   General: Normal range of motion.  ?   Cervical back: Normal range of motion.  ?Skin: ?   General: Skin is warm.  ?   Capillary Refill: Capillary refill takes less than 2 seconds.  ?Neurological:  ?   General: No focal deficit present.  ?   Mental Status: He is alert and oriented to person, place, and time. Mental status is at baseline.  ?Psychiatric:     ?   Mood and Affect: Mood normal.     ?   Behavior: Behavior normal.     ?   Thought Content: Thought content normal.     ?   Judgment: Judgment normal.  ? ? ?Assessment & Plan:  ? ?Problem List Items Addressed This Visit   ? ?  ? Respiratory  ? Allergic rhinitis  ?  Started Fexofenadine as needed for allergies.  ?  ?  ?  ? Other  ? Constipation  ?  Patient has off and on constipation. ?Encouraged patient to increase the intake of fluid and fiber. ?Encouraged patient to eat fiber rich food such as apple wit skin, banana, prune, oranges, carrot, potato, green beans lettuce, spinach and legumes.  ? ? ? ? ?  ?  ? Nausea  ?  Better now. ?Will continue to monitor. ?  ?  ? Anxiety - Primary  ?  Patient not taking paroxetine.  ?He wants to try life style changes and doing better with the anxiety now.  ? ?  ?  ? Relevant Medications  ? PARoxetine (PAXIL) 20 MG tablet  ? Seasonal allergies  ? ?Outpatient Encounter Medications as of 06/23/2021  ?Medication Sig  ? fexofenadine (ALLEGRA ALLERGY) 60 MG tablet Take 1 tablet (60 mg total) by mouth 2 (two) times daily.  ? ondansetron (ZOFRAN) 4 MG tablet Take 1 tablet (4 mg total) by mouth every 8 (eight) hours as needed for nausea or vomiting.  ? [EXPIRED] polyethylene glycol powder (GLYCOLAX/MIRALAX) 17 GM/SCOOP powder Take 17 g by mouth once for 1 dose.  ? PARoxetine (PAXIL) 20 MG tablet Take 20 mg by mouth daily. (Patient not taking: Reported on 06/23/2021)  ? ?No facility-administered encounter medications on file as of 06/23/2021.   ? ? ?Follow-up: No follow-ups on file.  ? ?Kara Dies, NP ? ?HPI ?

## 2021-06-23 ENCOUNTER — Ambulatory Visit (INDEPENDENT_AMBULATORY_CARE_PROVIDER_SITE_OTHER): Payer: 59 | Admitting: Nurse Practitioner

## 2021-06-23 ENCOUNTER — Encounter: Payer: Self-pay | Admitting: Nurse Practitioner

## 2021-06-23 VITALS — BP 121/71 | HR 68 | Ht 68.0 in | Wt 144.8 lb

## 2021-06-23 DIAGNOSIS — R110 Nausea: Secondary | ICD-10-CM

## 2021-06-23 DIAGNOSIS — K5900 Constipation, unspecified: Secondary | ICD-10-CM | POA: Diagnosis not present

## 2021-06-23 DIAGNOSIS — J3089 Other allergic rhinitis: Secondary | ICD-10-CM | POA: Diagnosis not present

## 2021-06-23 DIAGNOSIS — J302 Other seasonal allergic rhinitis: Secondary | ICD-10-CM | POA: Diagnosis not present

## 2021-06-23 DIAGNOSIS — F419 Anxiety disorder, unspecified: Secondary | ICD-10-CM | POA: Diagnosis not present

## 2021-06-23 DIAGNOSIS — R69 Illness, unspecified: Secondary | ICD-10-CM | POA: Diagnosis not present

## 2021-06-23 MED ORDER — FEXOFENADINE HCL 60 MG PO TABS: 60.0000 mg | ORAL_TABLET | Freq: Two times a day (BID) | ORAL | 2 refills | Status: DC

## 2021-06-23 MED ORDER — POLYETHYLENE GLYCOL 3350 17 GM/SCOOP PO POWD: 17.0000 g | Freq: Once | ORAL | 1 refills | Status: AC

## 2021-06-23 NOTE — Assessment & Plan Note (Signed)
Patient not taking paroxetine.  ?He wants to try life style changes and doing better with the anxiety now.  ? ?

## 2021-06-23 NOTE — Assessment & Plan Note (Signed)
Better now. ?Will continue to monitor. ?

## 2021-06-24 ENCOUNTER — Ambulatory Visit: Payer: 59 | Admitting: Nurse Practitioner

## 2021-06-25 NOTE — Assessment & Plan Note (Signed)
Started Fexofenadine as needed for allergies.  ?

## 2021-06-25 NOTE — Assessment & Plan Note (Signed)
Patient has off and on constipation. ?Encouraged patient to increase the intake of fluid and fiber. ?Encouraged patient to eat fiber rich food such as apple wit skin, banana, prune, oranges, carrot, potato, green beans lettuce, spinach and legumes.  ? ? ? ? ?

## 2021-07-29 ENCOUNTER — Encounter: Payer: 59 | Admitting: Nurse Practitioner

## 2021-08-25 ENCOUNTER — Ambulatory Visit (INDEPENDENT_AMBULATORY_CARE_PROVIDER_SITE_OTHER): Payer: 59 | Admitting: Nurse Practitioner

## 2021-08-25 ENCOUNTER — Encounter: Payer: Self-pay | Admitting: Nurse Practitioner

## 2021-08-25 VITALS — BP 126/81 | HR 68 | Ht 68.0 in | Wt 148.2 lb

## 2021-08-25 DIAGNOSIS — Z0000 Encounter for general adult medical examination without abnormal findings: Secondary | ICD-10-CM

## 2021-08-25 NOTE — Progress Notes (Unsigned)
Established Patient Office Visit  Subjective:  Patient ID: Carl Krueger, male    DOB: 1995/12/01  Age: 26 y.o. MRN: [id number]  CC:  Chief Complaint  Patient presents with   Annual Exam     HPI  Carl Krueger presents for annual physical.   Flu: no Tetanus: 02/01/2019 COVID: no Dentist: as needed  Eye examination: no  Exercise: no   Diet: Patient does/ does not eat meat. Patient consumes fruits and veggies. Patient eat fried food some times. Patient drinks water and sweet tea.     HPI   Past Medical History:  Diagnosis Date   Abdominal pain    Constipation    Nausea     Past Surgical History:  Procedure Laterality Date   EUSTACHIAN TUBE DILATION     TONSILLECTOMY      Family History  Problem Relation Age of Onset   Diabetes Mother    Hypertension Mother    Constipation Mother    Hypertension Father    Hypertension Sister    Headache Sister    Hypertension Brother    Seizures Brother    Diabetes Maternal Grandmother    Lung cancer Maternal Grandfather    Diabetes Paternal Grandmother     Social History   Socioeconomic History   Marital status: Single    Spouse name: Not on file   Number of children: 1   Years of education: Not on file   Highest education level: High school graduate  Occupational History   Not on file  Tobacco Use   Smoking status: Former    Types: E-cigarettes    Quit date: 10/06/2019    Years since quitting: 1.8   Smokeless tobacco: Never  Vaping Use   Vaping Use: Never used  Substance and Sexual Activity   Alcohol use: No   Drug use: No   Sexual activity: Yes    Birth control/protection: None  Other Topics Concern   Not on file  Social History Narrative   Not on file   Social Determinants of Health   Financial Resource Strain: Not on file  Food Insecurity: Not on file  Transportation Needs: Not on file  Physical Activity: Not on file  Stress: Not on file  Social Connections: Not on file   Intimate Partner Violence: Not on file     Outpatient Medications Prior to Visit  Medication Sig Dispense Refill   fexofenadine (ALLEGRA ALLERGY) 60 MG tablet Take 1 tablet (60 mg total) by mouth 2 (two) times daily. 30 tablet 2   ondansetron (ZOFRAN) 4 MG tablet Take 1 tablet (4 mg total) by mouth every 8 (eight) hours as needed for nausea or vomiting. 20 tablet 0   PARoxetine (PAXIL) 20 MG tablet Take 20 mg by mouth daily. (Patient not taking: Reported on 06/23/2021)     No facility-administered medications prior to visit.    No Known Allergies  ROS Review of Systems  Constitutional:  Negative for activity change and fatigue.  HENT:  Negative for congestion, facial swelling and sore throat.   Respiratory:  Negative for apnea and shortness of breath.   Cardiovascular:  Negative for chest pain.  Gastrointestinal:  Negative for abdominal distention and constipation.      Objective:    Physical Exam  BP 126/81   Pulse 68   Ht '5\' 8"'  (1.727 m)   Wt 148 lb 3.2 oz (67.2 kg)   BMI 22.53 kg/m  Wt Readings from Last 3 Encounters:  08/25/21 148 lb 3.2 oz (67.2 kg)  06/23/21 144 lb 12.8 oz (65.7 kg)  06/09/21 141 lb 1.6 oz (64 kg)     There are no preventive care reminders to display for this patient.  There are no preventive care reminders to display for this patient.  Lab Results  Component Value Date   TSH 0.957 08/31/2020   Lab Results  Component Value Date   WBC 5.5 08/31/2020   HGB 15.8 08/31/2020   HCT 45.8 08/31/2020   MCV 88 08/31/2020   PLT 251 08/31/2020   Lab Results  Component Value Date   NA 143 08/31/2020   K 4.4 08/31/2020   CO2 28 12/18/2016   GLUCOSE 84 08/31/2020   BUN 10 08/31/2020   CREATININE 1.05 08/31/2020   BILITOT 0.6 08/31/2020   ALKPHOS 68 08/31/2020   AST 15 08/31/2020   ALT 10 08/31/2020   PROT 7.1 08/31/2020   ALBUMIN 4.7 08/31/2020   CALCIUM 10.0 08/31/2020   ANIONGAP 8 12/18/2016   EGFR 102 08/31/2020   Lab Results   Component Value Date   CHOL 160 08/31/2020   Lab Results  Component Value Date   HDL 56 08/31/2020   Lab Results  Component Value Date   LDLCALC 89 08/31/2020   Lab Results  Component Value Date   TRIG 82 08/31/2020   Lab Results  Component Value Date   CHOLHDL 2.9 08/31/2020   No results found for: "HGBA1C"    Assessment & Plan:   Problem List Items Addressed This Visit   None    No orders of the defined types were placed in this encounter.    Follow-up: No follow-ups on file.    Theresia Lo, NP

## 2021-08-28 ENCOUNTER — Encounter: Payer: Self-pay | Admitting: Nurse Practitioner

## 2021-08-28 DIAGNOSIS — Z0000 Encounter for general adult medical examination without abnormal findings: Secondary | ICD-10-CM | POA: Insufficient documentation

## 2021-08-28 NOTE — Assessment & Plan Note (Signed)
Advised patient to eat balanced diet and perform regular physical activity. Tetanus: UTD

## 2021-09-08 ENCOUNTER — Ambulatory Visit: Payer: 59 | Admitting: Gastroenterology

## 2021-10-12 DIAGNOSIS — S93519A Sprain of interphalangeal joint of unspecified toe(s), initial encounter: Secondary | ICD-10-CM | POA: Insufficient documentation

## 2021-10-12 DIAGNOSIS — M79675 Pain in left toe(s): Secondary | ICD-10-CM | POA: Insufficient documentation

## 2021-10-12 DIAGNOSIS — S93502A Unspecified sprain of left great toe, initial encounter: Secondary | ICD-10-CM | POA: Diagnosis not present

## 2021-10-24 DIAGNOSIS — M79675 Pain in left toe(s): Secondary | ICD-10-CM | POA: Diagnosis not present

## 2021-11-25 ENCOUNTER — Ambulatory Visit: Payer: 59 | Admitting: Nurse Practitioner

## 2022-10-09 ENCOUNTER — Ambulatory Visit: Payer: Self-pay | Admitting: Physician Assistant

## 2022-10-09 ENCOUNTER — Encounter: Payer: Self-pay | Admitting: Physician Assistant

## 2022-10-09 VITALS — BP 128/83 | HR 79 | Temp 98.9°F | Resp 14 | Ht 68.0 in | Wt 148.0 lb

## 2022-10-09 DIAGNOSIS — R0981 Nasal congestion: Secondary | ICD-10-CM

## 2022-10-09 DIAGNOSIS — Z20818 Contact with and (suspected) exposure to other bacterial communicable diseases: Secondary | ICD-10-CM

## 2022-10-09 DIAGNOSIS — J020 Streptococcal pharyngitis: Secondary | ICD-10-CM

## 2022-10-09 LAB — POC COVID19 BINAXNOW: SARS Coronavirus 2 Ag: NEGATIVE

## 2022-10-09 LAB — POCT RAPID STREP A (OFFICE): Rapid Strep A Screen: NEGATIVE

## 2022-10-09 NOTE — Progress Notes (Signed)
Stated 2 days sore throat which usually hurts in am and at present level 1.  Taking PO well and stated not sore with eating or drinking.  Stated was around someone with strept throat.  No fever reported and said some congestion mild of his nares.  Denies cough.

## 2022-10-09 NOTE — Progress Notes (Signed)
   Subjective: Sore throat    Patient ID: Carl Krueger, male    DOB: [birth year], 27 y.o.   MRN: [id number]  HPI Patient complain of sore throat x 2 days.  Patient states worse with being awake and gets better during the day.  Patient state exposed to strep pharyngitis.  Able to tolerate food and fluids.   Review of Systems Allergic rhinitis and anxiety.    Objective:   Physical Exam BP 128/83  Pulse 79  Resp 14  Temp 98.9 F (37.2 C)  SpO2 99 %  Weight 148 lb (67.1 kg)  Height 5\' 8"  (1.727 m)   BMI 22.50 kg/m2  BSA 1.79 m2  Tobacco  Smoking status Former (Quit: 10/06/2019)  Used E-cigarettes  No acute distress.  HEENT is unremarkable.  No exudate on tonsils. Neck is supple without lymphadenopathy or bruits. Lungs are clear to auscultation. Heart regular rate and rhythm. COVID and rapid strep test were negative.     Assessment & Plan: Viral pharyngitis  Advised supportive care.  Follow-up if condition worsens.

## 2023-11-07 ENCOUNTER — Ambulatory Visit: Payer: Self-pay | Admitting: Physician Assistant

## 2023-11-07 ENCOUNTER — Encounter: Payer: Self-pay | Admitting: Physician Assistant

## 2023-11-07 DIAGNOSIS — R0981 Nasal congestion: Secondary | ICD-10-CM

## 2023-11-07 MED ORDER — FLUTICASONE PROPIONATE 50 MCG/ACT NA SUSP: 2.0000 | Freq: Every day | NASAL | 0 refills | Status: DC

## 2023-11-07 MED ORDER — FEXOFENADINE-PSEUDOEPHED ER 60-120 MG PO TB12: 1.0000 | ORAL_TABLET | Freq: Two times a day (BID) | ORAL | 0 refills | Status: AC

## 2023-11-07 NOTE — Progress Notes (Signed)
 Sinus pressure, pain in left ear, and drainage x 3days.

## 2023-11-07 NOTE — Progress Notes (Signed)
   Subjective: Nasal congestion    Patient ID: Carl Krueger, male    DOB: May 22, 1995, 28 y.o.   MRN: [id number]  HPI Patient presents for 3 days of nasal congestion intermittent postnasal drainage.  Denies fever/cough.  No recent travel or known contact with COVID-19.  Patient states in the past he used to use Claritin but has not helped.   Review of Systems Allergic rhinitis    Objective:   Physical Exam HEENT remarkable for bilateral edematous nasal turbinates.  Positive bilateral maxillary guarding.  Postnasal drainage. Neck is supple without lymphadenopathy or bruits. Heart regular rate and rhythm.       Assessment & Plan: Sinus congestion   Patient given a prescription for Flonase  and Allegra -D.  Advised to follow-up if no improvement in 5 to 7 days.

## 2023-11-29 ENCOUNTER — Other Ambulatory Visit: Payer: Self-pay | Admitting: Physician Assistant
